# Patient Record
Sex: Female | Born: 1950 | Race: Black or African American | Hispanic: No | Smoking: Never smoker
Health system: Southern US, Community
[De-identification: ages and names within clinical notes are randomized; demographics above are authoritative.]

## PROBLEM LIST (undated history)

## (undated) DIAGNOSIS — T4145XA Adverse effect of unspecified anesthetic, initial encounter: Secondary | ICD-10-CM

## (undated) DIAGNOSIS — I890 Lymphedema, not elsewhere classified: Secondary | ICD-10-CM

## (undated) DIAGNOSIS — M1990 Unspecified osteoarthritis, unspecified site: Secondary | ICD-10-CM

## (undated) DIAGNOSIS — C4922 Malignant neoplasm of connective and soft tissue of left lower limb, including hip: Secondary | ICD-10-CM

## (undated) HISTORY — PX: APPENDECTOMY: SHX54

## (undated) HISTORY — PX: TUBAL LIGATION: SHX77

## (undated) HISTORY — DX: Malignant neoplasm of connective and soft tissue of left lower limb, including hip: C49.22

## (undated) HISTORY — PX: TONSILLECTOMY: SUR1361

---

## 1998-10-21 ENCOUNTER — Other Ambulatory Visit: Admission: RE | Admit: 1998-10-21 | Discharge: 1998-10-21 | Payer: Self-pay | Admitting: Family Medicine

## 1998-11-30 ENCOUNTER — Encounter: Payer: Self-pay | Admitting: Emergency Medicine

## 1998-11-30 ENCOUNTER — Emergency Department (HOSPITAL_COMMUNITY): Admission: EM | Admit: 1998-11-30 | Discharge: 1998-11-30 | Payer: Self-pay | Admitting: Emergency Medicine

## 2001-09-28 ENCOUNTER — Ambulatory Visit (HOSPITAL_COMMUNITY): Admission: RE | Admit: 2001-09-28 | Discharge: 2001-09-28 | Payer: Self-pay | Admitting: Gastroenterology

## 2001-09-28 ENCOUNTER — Encounter (INDEPENDENT_AMBULATORY_CARE_PROVIDER_SITE_OTHER): Payer: Self-pay | Admitting: *Deleted

## 2004-03-11 ENCOUNTER — Other Ambulatory Visit: Admission: RE | Admit: 2004-03-11 | Discharge: 2004-03-11 | Payer: Self-pay | Admitting: Family Medicine

## 2004-10-21 ENCOUNTER — Encounter: Admission: RE | Admit: 2004-10-21 | Discharge: 2004-10-21 | Payer: Self-pay | Admitting: Family Medicine

## 2005-06-24 ENCOUNTER — Other Ambulatory Visit: Admission: RE | Admit: 2005-06-24 | Discharge: 2005-06-24 | Payer: Self-pay | Admitting: Family Medicine

## 2006-07-23 ENCOUNTER — Other Ambulatory Visit: Admission: RE | Admit: 2006-07-23 | Discharge: 2006-07-23 | Payer: Self-pay | Admitting: Family Medicine

## 2007-08-18 ENCOUNTER — Other Ambulatory Visit: Admission: RE | Admit: 2007-08-18 | Discharge: 2007-08-18 | Payer: Self-pay | Admitting: Family Medicine

## 2008-09-11 ENCOUNTER — Other Ambulatory Visit: Admission: RE | Admit: 2008-09-11 | Discharge: 2008-09-11 | Payer: Self-pay | Admitting: Family Medicine

## 2009-10-08 ENCOUNTER — Other Ambulatory Visit: Admission: RE | Admit: 2009-10-08 | Discharge: 2009-10-08 | Payer: Self-pay | Admitting: Family Medicine

## 2010-03-16 DIAGNOSIS — T8859XA Other complications of anesthesia, initial encounter: Secondary | ICD-10-CM

## 2010-03-16 DIAGNOSIS — I890 Lymphedema, not elsewhere classified: Secondary | ICD-10-CM

## 2010-03-16 DIAGNOSIS — C4922 Malignant neoplasm of connective and soft tissue of left lower limb, including hip: Secondary | ICD-10-CM

## 2010-03-16 HISTORY — DX: Lymphedema, not elsewhere classified: I89.0

## 2010-03-16 HISTORY — DX: Malignant neoplasm of connective and soft tissue of left lower limb, including hip: C49.22

## 2010-03-16 HISTORY — DX: Other complications of anesthesia, initial encounter: T88.59XA

## 2010-03-16 HISTORY — PX: OTHER SURGICAL HISTORY: SHX169

## 2010-07-28 ENCOUNTER — Other Ambulatory Visit: Payer: Self-pay | Admitting: Family Medicine

## 2010-08-01 NOTE — Procedures (Signed)
[facility]  Patient:    Rachel Trevino, HARVELL Visit Number: [id number] MRN: [id number]          Service Type: END Location: ENDO Attending Physician:  Louie Bun Dictated by:   Everardo All Madilyn Fireman, M.D. Proc. Date: 09/28/01 Admit Date:  09/28/2001 Discharge Date: 09/28/2001   CC:         Duncan Dull, M.D.   Procedure Report  PROCEDURE:  Colonoscopy with polypectomy.  INDICATION FOR PROCEDURE:  Screening colonoscopy in a 60 year old patient with no prior colon screening.  DESCRIPTION OF PROCEDURE:  The patient was placed in the left lateral decubitus position then placed on the pulse monitor with continuous low flow oxygen delivered by nasal cannula. She was sedated with 60 mg IV Demerol and 6 mg IV Versed. The Olympus video colonoscope was inserted into the rectum and advanced to the cecum, confirmed by transillumination at McBurneys point and visualization of the ileocecal valve and appendiceal orifice. The prep was excellent. The cecum, ascending, transverse and descending colon all appeared normal with no masses, polyps, diverticula or other mucosal abnormalities. Within the rectum there was seen a small sessile polyp approximately 5 mm in diameter. This was fulgurated by hot biopsy. The remainder of the rectum appeared normal. The colonoscope was then withdrawn and the patient returned to the recovery room in stable condition. The patient tolerated the procedure well and there were no immediate complications.  IMPRESSION: 1. Small rectal polyp. 2. Left sided diverticulosis.  PLAN:  Await histology for determination of method and interval for future colon screening. Dictated by:   Everardo All Madilyn Fireman, M.D. Attending Physician:  Louie Bun DD:  09/28/01 TD:  10/03/01 Job: [id number]

## 2010-08-06 ENCOUNTER — Ambulatory Visit
Admission: RE | Admit: 2010-08-06 | Discharge: 2010-08-06 | Disposition: A | Discharge status: Home / Self Care | Payer: BC Managed Care – PPO | Source: Ambulatory Visit | Attending: Family Medicine | Admitting: Family Medicine

## 2010-08-08 ENCOUNTER — Ambulatory Visit (HOSPITAL_COMMUNITY)
Admission: RE | Admit: 2010-08-08 | Discharge: 2010-08-08 | Disposition: A | Discharge status: Home / Self Care | Payer: BC Managed Care – PPO | Source: Ambulatory Visit | Attending: Family Medicine | Admitting: Family Medicine

## 2010-08-08 ENCOUNTER — Other Ambulatory Visit: Payer: Self-pay | Admitting: Family Medicine

## 2010-08-08 ENCOUNTER — Other Ambulatory Visit (HOSPITAL_COMMUNITY): Payer: Self-pay | Admitting: Family Medicine

## 2010-08-08 DIAGNOSIS — R229 Localized swelling, mass and lump, unspecified: Secondary | ICD-10-CM | POA: Insufficient documentation

## 2010-08-08 DIAGNOSIS — C4920 Malignant neoplasm of connective and soft tissue of unspecified lower limb, including hip: Secondary | ICD-10-CM | POA: Insufficient documentation

## 2010-08-08 LAB — CREATININE, SERUM
Creatinine, Ser: 0.65 mg/dL (ref 0.4–1.2)
GFR calc Af Amer: >60 mL/min (ref >60)

## 2010-08-08 MED ORDER — GADOBENATE DIMEGLUMINE 529 MG/ML IV SOLN
18.0000 mL | Freq: Once | INTRAVENOUS | Status: AC
  Administered 2010-08-08: 18 mL via INTRAVENOUS

## 2010-08-14 ENCOUNTER — Encounter: Payer: BC Managed Care – PPO | Admitting: Internal Medicine

## 2010-11-05 ENCOUNTER — Other Ambulatory Visit (HOSPITAL_COMMUNITY)
Admission: RE | Admit: 2010-11-05 | Discharge: 2010-11-05 | Disposition: A | Discharge status: Home / Self Care | Payer: BC Managed Care – PPO | Source: Ambulatory Visit | Attending: Family Medicine | Admitting: Family Medicine

## 2010-11-05 DIAGNOSIS — Z124 Encounter for screening for malignant neoplasm of cervix: Secondary | ICD-10-CM | POA: Insufficient documentation

## 2011-04-22 ENCOUNTER — Ambulatory Visit: Payer: BC Managed Care – PPO | Attending: Orthopedic Surgery | Admitting: Physical Therapy

## 2011-04-22 DIAGNOSIS — I890 Lymphedema, not elsewhere classified: Secondary | ICD-10-CM | POA: Insufficient documentation

## 2011-04-22 DIAGNOSIS — IMO0001 Reserved for inherently not codable concepts without codable children: Secondary | ICD-10-CM | POA: Insufficient documentation

## 2011-04-27 ENCOUNTER — Ambulatory Visit: Payer: BC Managed Care – PPO | Admitting: Physical Therapy

## 2011-04-30 ENCOUNTER — Ambulatory Visit: Payer: BC Managed Care – PPO | Admitting: Physical Therapy

## 2011-05-04 ENCOUNTER — Ambulatory Visit: Payer: BC Managed Care – PPO

## 2011-05-06 ENCOUNTER — Ambulatory Visit: Payer: BC Managed Care – PPO

## 2011-05-11 ENCOUNTER — Ambulatory Visit: Payer: BC Managed Care – PPO

## 2011-05-13 ENCOUNTER — Ambulatory Visit: Payer: BC Managed Care – PPO | Admitting: Physical Therapy

## 2011-05-20 ENCOUNTER — Encounter: Payer: BC Managed Care – PPO | Admitting: Physical Therapy

## 2012-02-04 ENCOUNTER — Other Ambulatory Visit: Payer: Self-pay | Admitting: Family Medicine

## 2012-02-04 ENCOUNTER — Other Ambulatory Visit (HOSPITAL_COMMUNITY)
Admission: RE | Admit: 2012-02-04 | Discharge: 2012-02-04 | Disposition: A | Discharge status: Home / Self Care | Payer: BC Managed Care – PPO | Source: Ambulatory Visit | Attending: Family Medicine | Admitting: Family Medicine

## 2012-02-04 DIAGNOSIS — Z124 Encounter for screening for malignant neoplasm of cervix: Secondary | ICD-10-CM | POA: Insufficient documentation

## 2012-12-23 ENCOUNTER — Other Ambulatory Visit: Payer: Self-pay

## 2013-11-16 ENCOUNTER — Other Ambulatory Visit: Payer: Self-pay | Admitting: Family Medicine

## 2013-11-16 DIAGNOSIS — R229 Localized swelling, mass and lump, unspecified: Principal | ICD-10-CM

## 2013-11-16 DIAGNOSIS — IMO0002 Reserved for concepts with insufficient information to code with codable children: Secondary | ICD-10-CM

## 2013-11-21 ENCOUNTER — Other Ambulatory Visit: Payer: BC Managed Care – PPO

## 2013-12-05 ENCOUNTER — Ambulatory Visit
Admission: RE | Admit: 2013-12-05 | Discharge: 2013-12-05 | Disposition: A | Discharge status: Home / Self Care | Payer: BC Managed Care – PPO | Source: Ambulatory Visit | Attending: Family Medicine | Admitting: Family Medicine

## 2013-12-05 DIAGNOSIS — R229 Localized swelling, mass and lump, unspecified: Principal | ICD-10-CM

## 2013-12-05 DIAGNOSIS — IMO0002 Reserved for concepts with insufficient information to code with codable children: Secondary | ICD-10-CM

## 2014-03-22 ENCOUNTER — Ambulatory Visit: Payer: Medicare Other | Attending: Family Medicine | Admitting: Physical Therapy

## 2014-03-22 DIAGNOSIS — M25551 Pain in right hip: Secondary | ICD-10-CM | POA: Diagnosis not present

## 2014-03-22 DIAGNOSIS — G8929 Other chronic pain: Secondary | ICD-10-CM | POA: Insufficient documentation

## 2014-03-22 DIAGNOSIS — M25561 Pain in right knee: Secondary | ICD-10-CM | POA: Insufficient documentation

## 2014-03-22 NOTE — Therapy (Signed)
[place], [place], [address] Phone: [phone]   Fax:  [phone]  Physical Therapy Evaluation  Patient Details  Name: Rachel Trevino MRN: [id number] Date of Birth: 1950-09-24 Referring Provider:  Lilian Coma, MD  Encounter Date: 03/22/2014      PT End of Session - 03/22/14 1439    Visit Number 1   Number of Visits 12   Date for PT Re-Evaluation 05/03/14   PT Start Time 1332   PT Stop Time 1419   PT Time Calculation (min) 47 min   Activity Tolerance Patient tolerated treatment well      No past medical history on file.  No past surgical history on file.  There were no vitals taken for this visit.  Visit Diagnosis:  Knee pain, chronic, right - Plan: PT plan of care cert/re-cert  Hip pain, chronic, right - Plan: PT plan of care cert/re-cert      Subjective Assessment - 03/22/14 1338    Symptoms Patient presents with progressive pain in Rt. knee and hip.  She feels that because of her L. LE surgery (CA removal) and she favored her Rt. LE with gait.  She has pain in hip and knee, difficulty wallking, stiffness, RLE weakness.     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     Limitations Sitting;Walking   How long can you sit comfortably? 15 min   How long can you stand comfortably? 1 hour   How long can you walk comfortably? 1 hour   Diagnostic tests XR, having a bone density   Patient Stated Goals no pain   Currently in Pain? Yes   Pain Score 2    Pain Location Hip   Pain Orientation Right;Anterior   Pain Descriptors / Indicators Aching   Pain Type Chronic pain   Pain Onset More than a month ago   Pain Frequency Intermittent   Pain Relieving Factors pain meds, rest   Multiple Pain Sites Yes   Pain Score 2   Pain Type Chronic pain   Pain Location Knee   Pain Orientation Right;Anterior;Medial   Pain Descriptors / Indicators Aching   Pain  Frequency Intermittent   Pain Onset Progressive          [facility] PT Assessment - 03/22/14 1344    Assessment   Medical Diagnosis hip and knee arthritis   Onset Date --  chronic 2013   Next MD Visit unsure   Prior Therapy --  for LLE, lymphedema   Precautions   Precautions None   Restrictions   Weight Bearing Restrictions No   Balance Screen   Has the patient fallen in the past 6 months No   Has the patient had a decrease in activity level because of a fear of falling?  Yes   Is the patient reluctant to leave their home because of a fear of falling?  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   Overall Cognitive Status Within Functional Limits for tasks assessed   Observation/Other Assessments   Focus on Therapeutic Outcomes (FOTO)  58% limited   Sensation   Light Touch Appears Intact   Posture/Postural Control  Posture/Postural Control Postural limitations   Postural Limitations Anterior pelvic tilt;Weight shift left   Posture Comments increased pelvic rot R with gait, walks with limp favoring the LLE   AROM   Right Hip Flexion 85   Right Hip External Rotation  15   Left Hip Flexion 80   Left Hip External Rotation  45   Right Knee Extension 0   Right Knee Flexion 126   Left Knee Extension 0   Left Knee Flexion 128   Strength   Right Hip Flexion 4/5  pain   Right Hip Extension 3/5   Right Hip ABduction 3+/5   Left Hip Flexion --  4+/5   Left Hip Extension 3+/5   Left Hip ABduction 3+/5   Right Knee Flexion --  4+/5   Right Knee Extension --  4+/5   Left Knee Flexion 4/5   Left Knee Extension --  4+/5   Right Ankle Dorsiflexion 5/5   Left Ankle Dorsiflexion 5/5          [facility] Adult PT  Treatment/Exercise - 03/22/14 1344    Knee/Hip Exercises: Stretches   Hip Flexor Stretch 1 rep;30 seconds   Hip Flexor Stretch Limitations --  off table each LE   Knee/Hip Exercises: Supine   Other Supine Knee Exercises supine lower trunk rotaition with feet wide to ER and IR hip          PT Education - 03/22/14 1436    Education provided Yes   Education Details PT/POC, HEP   Person(s) Educated Patient   Methods Explanation;Demonstration;Handout   Comprehension Verbalized understanding;Returned demonstration          PT Short Term Goals - 03/22/14 1442    PT SHORT TERM GOAL #1   Title Pt will be I with initial HEP   Time 2   Period Weeks   Status New   PT SHORT TERM GOAL #2   Title Pt. will be able to sit for 20 min and report less difficulty getting up from chair.    Time 2   Period Weeks   Status New           PT Long Term Goals - 03/22/14 1443    PT LONG TERM GOAL #1   Title Pt. will be I with use of RICE vs heat and advanced HEP   Time 6   Period Weeks   Status New   PT LONG TERM GOAL #2   Title Pt will be able to sit 30 min for meals and report min pain/difficulty upon standing   Time 6   Period Weeks   Status New   PT LONG TERM GOAL #3   Title Pt will demo FOTO to less than 45% limited by knee/hip pain    Time 6   Period Weeks   Status New   PT LONG TERM GOAL #4   Title Pt. will report pain/comfort at night as improved 50% or better.    Time 6   Period Weeks   Status New   PT LONG TERM GOAL #5   Title Pt will walk without limp without cues most of the time   Time 6   Period Weeks   Status New               Plan - 03/22/14 1439    Clinical Impression Statement This patient exhibits hip>knee limitations in ROM and strength.  She will benefit from skilled PT to improve functional mobility.  Pt will benefit from skilled therapeutic intervention in order to improve on the following deficits Abnormal gait;Decreased activity  tolerance;Decreased mobility;Decreased strength;Pain;Increased fascial restricitons;Impaired flexibility;Decreased range of motion;Difficulty walking   Rehab Potential Good   PT Frequency 2x / week   PT Duration 4 weeks  4-6 weeks if needed   PT Treatment/Interventions ADLs/Self Care Home Management;Moist Heat;Therapeutic activities;Patient/family education;Passive range of motion;Therapeutic exercise;Ultrasound;Gait training;Manual techniques;Neuromuscular re-education;Cryotherapy;Electrical Stimulation;Functional mobility training   PT Next Visit Plan review HEP (hip ER/IR and psoas stretch) advance exercises, begin core/hip strength   PT Home Exercise Plan see above   Consulted and Agree with Plan of Care Patient          G-Codes - 04/21/2014 1449    Functional Assessment Tool Used FOTO   Functional Limitation Mobility: Walking and moving around   Mobility: Walking and Moving Around Current Status [phone]) At least 40 percent but less than 60 percent impaired, limited or restricted   Mobility: Walking and Moving Around Goal Status [phone]) At least 20 percent but less than 40 percent impaired, limited or restricted       Problem List There are no active problems to display for this patient.   Alyce Inscore 04/21/14, 2:51 PM  [facility] [address] [place], [place], [address] Phone: [phone]   Fax:  [phone]  Raeford Razor, PT April 21, 2014 2:52 PM Phone: [phone] Fax: [phone]

## 2014-03-22 NOTE — Patient Instructions (Addendum)
Lie flat (floor or bed) feet apart Drop R knee to L as the L knee goes to the L as well.   Then drop L. Knee to Rt. As Rt. Knee goes to the Rt.   Lower Trunk Rotation Stretch   Keeping back flat and feet together, rotate knees to left side. Hold ___10_ seconds. Repeat __10 times per set. Do __1-2__ sets per session. Do __1-2__ sessions per day.  http://orth.exer.us/123   Copyright  VHI. All rights reserved.   Hip Flexor Stretch   Lying on back near edge of bed, bend one leg, foot flat. Hang other leg over edge, relaxed, thigh resting entirely on bed for __1__ minutes. Repeat ___2-3_ times. Do ____ sessions per day. Advanced Exercise: Bend knee back keeping thigh in contact with bed.  http://gt2.exer.us/347   Copyright  VHI. All rights reserved.

## 2014-04-04 ENCOUNTER — Encounter: Payer: Self-pay | Admitting: Physical Therapy

## 2014-04-04 ENCOUNTER — Ambulatory Visit: Payer: Medicare Other | Admitting: Physical Therapy

## 2014-04-04 DIAGNOSIS — G8929 Other chronic pain: Secondary | ICD-10-CM

## 2014-04-04 DIAGNOSIS — M25561 Pain in right knee: Secondary | ICD-10-CM | POA: Diagnosis not present

## 2014-04-04 DIAGNOSIS — M25551 Pain in right hip: Principal | ICD-10-CM

## 2014-04-04 NOTE — Patient Instructions (Signed)
Hip Flexion / Knee Extension: Straight-Leg Raise (Eccentric)   Lie on back. Lift leg with knee straight. Slowly lower leg for 3-5 seconds. __10_ reps per set, _1-2__ sets per day, _5__ days per week. ABDUCTION: Side-Lying (Active)   Lie on left side, top leg straight. Raise top leg as far as possible keeping your hips stacked. Do not roll back or forward  Complete __1-2_ sets of ___10  repetitions. Perform _1-2__ sessions per day http://gtsc.exer.us/94      Copyright  VHI. All rights reserved.    Bridge [employer] small of back into mat, maintain pelvic tilt, roll up one vertebrae at a time. Focus on engaging posterior hip muscles. Hold for ___1-2_ breaths. Repeat ___10_ times.  Copyright  VHI. All rights reserved.

## 2014-04-04 NOTE — Therapy (Signed)
[place], [place], [address] Phone: [phone]   Fax:  [phone]  Physical Therapy Treatment  Patient Details  Name: Rachel Trevino MRN: [id number] Date of Birth: [birth year] Referring Provider:  Lilian Coma, MD  Encounter Date: 04/04/2014      PT End of Session - 04/04/14 1249    Visit Number 2   Number of Visits 12   Date for PT Re-Evaluation 05/03/14   PT Start Time 1230   PT Stop Time 1325   PT Time Calculation (min) 55 min      History reviewed. No pertinent past medical history.  History reviewed. No pertinent past surgical history.  There were no vitals taken for this visit.  Visit Diagnosis:  Hip pain, chronic, right  Knee pain, chronic, right      Subjective Assessment - 04/04/14 1233    Symptoms Patient without c/o pain today now, took Tylenol.  Tried the stretches and they help.    Currently in Pain? No/denies          [facility] Adult PT Treatment/Exercise - 04/04/14 1237    Knee/Hip Exercises: Stretches   Active Hamstring Stretch 3 reps;30 seconds   Hip Flexor Stretch 3 reps;30 seconds   ITB Stretch 3 reps;30 seconds   Soleus Stretch --  Other: hip ER/IRx 5 each way   Soleus Stretch Limitations hip add/ER stretch with strap 30 sec x 2   Knee/Hip Exercises: Aerobic   Stationary Bike NuStep level 5 for 5 min UE and LE   Knee/Hip Exercises: Supine   Bridges 1 set;10 reps   Straight Leg Raises Both;1 set;10 reps   Knee/Hip Exercises: Sidelying   Hip ABduction Strengthening;Both;1 set;20 reps   Other Sidelying Knee Exercises side kick series forward and back   Manual Therapy   Passive ROM --  Rt. hip ER/IR and quad stretch           PT Education - 04/04/14 1247    Education provided Yes   Education Details pain vs stretch, HEP   Person(s) Educated Patient   Methods Explanation;Demonstration;Handout   Comprehension Verbalized understanding          PT  Short Term Goals - 03/22/14 1442    PT SHORT TERM GOAL #1   Title Pt will be I with initial HEP   Time 2   Period Weeks   Status New   PT SHORT TERM GOAL #2   Title Pt. will be able to sit for 20 min and report less difficulty getting up from chair.    Time 2   Period Weeks   Status New           PT Long Term Goals - 03/22/14 1443    PT LONG TERM GOAL #1   Title Pt. will be I with use of RICE vs heat and advanced HEP   Time 6   Period Weeks   Status New   PT LONG TERM GOAL #2   Title Pt will be able to sit 30 min for meals and report min pain/difficulty upon standing   Time 6   Period Weeks   Status New   PT LONG TERM GOAL #3   Title Pt will demo FOTO to less than 45% limited by knee/hip pain    Time 6   Period Weeks   Status New   PT LONG TERM GOAL #4   Title Pt. will report pain/comfort at night as improved  50% or better.    Time 6   Period Weeks   Status New   PT LONG TERM GOAL #5   Title Pt will walk without limp without cues most of the time   Time 6   Period Weeks   Status New               Plan - 04/04/14 1313    Clinical Impression Statement Patient tolerated session well and reports relief of pain with walking in clinic. No goals met, 2nd visit. Eager for more HEP   PT Next Visit Plan review HEP, advance, repeat heat   PT Home Exercise Plan SLR, bridge added today   Consulted and Agree with Plan of Care Patient        Problem List There are no active problems to display for this patient.   PAA,JENNIFER 04/04/2014, 1:17 PM  [facility] [address] [place], [place], [address] Phone: [phone]   Fax:  [phone]

## 2014-04-11 ENCOUNTER — Ambulatory Visit: Payer: Medicare Other | Admitting: Physical Therapy

## 2014-04-11 ENCOUNTER — Telehealth: Payer: Self-pay | Admitting: *Deleted

## 2014-04-11 DIAGNOSIS — M25561 Pain in right knee: Secondary | ICD-10-CM | POA: Diagnosis not present

## 2014-04-11 DIAGNOSIS — M25551 Pain in right hip: Principal | ICD-10-CM

## 2014-04-11 DIAGNOSIS — G8929 Other chronic pain: Secondary | ICD-10-CM

## 2014-04-11 NOTE — Telephone Encounter (Signed)
Pt requested more appts...td

## 2014-04-12 NOTE — Therapy (Signed)
[place], [place], [address] Phone: [phone]   Fax:  [phone]  Physical Therapy Treatment  Patient Details  Name: Rachel Trevino MRN: [id number] Date of Birth: Nov 30, 1950 Referring Provider:  Lilian Coma, MD  Encounter Date: 04/11/2014      PT End of Session - 04/11/14 1241    Visit Number 3   Number of Visits 12   Date for PT Re-Evaluation 05/03/14   PT Start Time 1233   PT Stop Time 1326   PT Time Calculation (min) 53 min   Activity Tolerance Patient tolerated treatment well      No past medical history on file.  No past surgical history on file.  There were no vitals taken for this visit.  Visit Diagnosis:  Hip pain, chronic, right  Knee pain, chronic, right      Subjective Assessment - 04/11/14 1236    Symptoms Stiffness and sore today, been doing my exercises and using heat   Currently in Pain? Yes   Pain Score 7    Pain Location Hip   Pain Orientation Right;Posterior   Pain Descriptors / Indicators Aching   Pain Type Chronic pain   Pain Onset More than a month ago   Pain Frequency Intermittent   Multiple Pain Sites No                    [facility] Adult PT Treatment/Exercise - 04/11/14 1238    Knee/Hip Exercises: Stretches   Active Hamstring Stretch 2 reps;30 seconds   ITB Stretch 2 reps;30 seconds   ITB Stretch Limitations followed by add stretch 2x 30 sec   Knee/Hip Exercises: Machines for Strengthening   Cybex Leg Press 1 plate x 10 x 3 with varied stances   Knee/Hip Exercises: Supine   Bridges Strengthening;Both;1 set;10 reps   Straight Leg Raises 1 set;10 reps   Straight Leg Raise with External Rotation Strengthening;Both;1 set   Straight Leg Raise with External Rotation Limitations very difficult to ER Rt. hip, unable to do > 5 reps on Rt.    Other Supine Knee Exercises hip circles with T band black x 10 each    Other Supine Knee Exercises hip ER/IR  with trunk rot x 10   Moist Heat Therapy   Number Minutes Moist Heat 10 Minutes   Moist Heat Location --  Rt. hip/groin                PT Education - 04/12/14 0824    Education provided Yes   Education Details pain vs strain vs stretch with SLR   Person(s) Educated Patient   Methods Explanation;Demonstration   Comprehension Verbalized understanding          PT Short Term Goals - 04/11/14 1241    PT SHORT TERM GOAL #1   Title Pt will be I with initial HEP   Status Achieved   PT SHORT TERM GOAL #2   Title Pt. will be able to sit for 20 min and report less difficulty getting up from chair.    Status On-going           PT Long Term Goals - 04/11/14 1241    PT LONG TERM GOAL #1   Title Pt. will be I with use of RICE vs heat and advanced HEP   Status On-going   PT LONG TERM GOAL #2   Title Pt will be able to sit 30 min for meals and  report min pain/difficulty upon standing   Status On-going   PT LONG TERM GOAL #3   Title Pt will demo FOTO to less than 45% limited by knee/hip pain    Status On-going   PT LONG TERM GOAL #4   Title Pt. will report pain/comfort at night as improved 50% or better.    Status On-going   PT LONG TERM GOAL #5   Title Pt will walk without limp without cues most of the time   Status On-going               Plan - 04/11/14 1315    Clinical Impression Statement No new goals met other than I with initial HEP.  Limited in Rt hip ER/IR and  ability to lift Rt. LE off table.     PT Next Visit Plan continue, try standing SLR and/or reformer for footwork, bridge and leg circles        Problem List There are no active problems to display for this patient.   Rachel Trevino 04/12/2014, 8:27 AM  [place], [place], [address] Phone: [phone]   Fax:  [phone]

## 2014-04-18 ENCOUNTER — Ambulatory Visit: Payer: Medicare Other | Attending: Family Medicine | Admitting: Physical Therapy

## 2014-04-18 DIAGNOSIS — M25551 Pain in right hip: Secondary | ICD-10-CM | POA: Insufficient documentation

## 2014-04-18 DIAGNOSIS — G8929 Other chronic pain: Secondary | ICD-10-CM | POA: Diagnosis not present

## 2014-04-18 DIAGNOSIS — M25561 Pain in right knee: Secondary | ICD-10-CM | POA: Insufficient documentation

## 2014-04-18 NOTE — Therapy (Signed)
[place], [place], [address] Phone: [phone]   Fax:  [phone]  Physical Therapy Treatment  Patient Details  Name: Rachel Trevino MRN: [id number] Date of Birth: [birth year] Referring Provider:  Lilian Coma, MD  Encounter Date: 04/18/2014      PT End of Session - 04/18/14 0829    Visit Number 4   Number of Visits 12   Date for PT Re-Evaluation 05/03/14   PT Start Time 0804      No past medical history on file.  No past surgical history on file.  There were no vitals taken for this visit.  Visit Diagnosis:  Hip pain, chronic, right  Knee pain, chronic, right      Subjective Assessment - 04/18/14 0807    Symptoms Hurting today, this is early!   Currently in Pain? Yes   Pain Score 7    Pain Location Hip   Pain Orientation Right;Anterior   Pain Descriptors / Indicators Aching   Pain Type Chronic pain   Pain Radiating Towards thigh and Rt. knee   Pain Onset More than a month ago   Pain Frequency Intermittent   Aggravating Factors  unsure   Pain Relieving Factors gentle exercise   Multiple Pain Sites No           [facility] Adult PT Treatment/Exercise - 04/18/14 0810    Knee/Hip Exercises: Aerobic   Stationary Bike NuStep level 5 for 5 min UE and LE   Moist Heat Therapy   Number Minutes Moist Heat 15 Minutes   Moist Heat Location Other (comment)  Rt. ant hip/groin    Pilates Reformer used for LE/core strength, postural strength, lumbopelvic disassociation and core control.  Exercises included: Footwork: Neutral, V and heel raises 2 Red and 1 Blue. Cues to fully extend knees and maintain PF  Feet in Straps 1 Red spring, needs min A to get into position.  Parallel Arcs, ER and IR x 10 each     Hip Circles 1 Red, x 10 each direction.  Decr hip ER on Rt.  Poor control of Lt. LE.  Maintains neutral spine well Bridging all springs on x 10- max cues to maintain pelvis post tilted to  articulate spine.  Practiced mat bridging as well to do at home, limited by tight hip flexors as well. Pt tired but states "I feel like nothing's wrong with me" after exercise.        PT Education - 04/18/14 0818    Education provided Yes   Education Details Pilates reformer   Person(s) Educated Patient   Methods Explanation;Demonstration   Comprehension Verbalized understanding;Need further instruction          PT Short Term Goals - 04/11/14 1241    PT SHORT TERM GOAL #1   Title Pt will be I with initial HEP   Status Achieved   PT SHORT TERM GOAL #2   Title Pt. will be able to sit for 20 min and report less difficulty getting up from chair.    Status On-going           PT Long Term Goals - 04/11/14 1241    PT LONG TERM GOAL #1   Title Pt. will be I with use of RICE vs heat and advanced HEP   Status On-going   PT LONG TERM GOAL #2   Title Pt will be able to sit 30 min for meals and report min pain/difficulty upon standing  Status On-going   PT LONG TERM GOAL #3   Title Pt will demo FOTO to less than 45% limited by knee/hip pain    Status On-going   PT LONG TERM GOAL #4   Title Pt. will report pain/comfort at night as improved 50% or better.    Status On-going   PT LONG TERM GOAL #5   Title Pt will walk without limp without cues most of the time   Status On-going               Plan - 04/18/14 8185    Clinical Impression Statement Patient continues with pain and stiffness related to arthritis.  She tolerated Pilates Reformer today which reiterated decreased ER and IR of Rt. hip. No new goals met   PT Next Visit Plan try Reformer again, ant hip stretch   PT Home Exercise Plan SLR, bridge added today, cont   Consulted and Agree with Plan of Care Patient       Raeford Razor, PT 04/18/2014 8:56 AM Phone: [phone] Fax: [phone]   Problem List There are no active problems to display for this patient.   [name] 04/18/2014, 8:51 AM  [facility] [address] [place], [place], [address] Phone: [phone]   Fax:  [phone]

## 2014-04-23 ENCOUNTER — Telehealth: Payer: Self-pay | Admitting: *Deleted

## 2014-04-23 ENCOUNTER — Ambulatory Visit: Payer: Medicare Other | Admitting: Physical Therapy

## 2014-04-23 DIAGNOSIS — M25561 Pain in right knee: Secondary | ICD-10-CM

## 2014-04-23 DIAGNOSIS — M25551 Pain in right hip: Principal | ICD-10-CM

## 2014-04-23 DIAGNOSIS — G8929 Other chronic pain: Secondary | ICD-10-CM

## 2014-04-23 NOTE — Patient Instructions (Signed)
Hip Extension (Prone)   Copyright  VHI. All rights reserved.  Abduction: Side Leg Lift (Eccentric) - Side-Lying   Lie on side. Lift top leg slightly higher than shoulder level. Keep top leg straight with body, toes pointing forward. Slowly lower for 3-5 seconds. __10_ reps per set, __1_ sets per day, ___ days per week. Add ___ lbs when you achieve ___ repetitions.  Copyright  VHI. All rights reserved.  Strengthening: Straight Leg Raise (Phase 1)   Tighten muscles on front of right thigh, then lift leg __121__ inches from surface, keeping knee locked.  Repeat __10__ times per set. Do __1__ sets per session. Do _1___ sessions per day.  http://orth.exer.us/614   Copyright  VHI. All rights reserved.  Hamstring Stretch, Reclined (Strap, Doorframe)   Lengthen bottom leg on floor. Extend top leg along edge of doorframe or press foot up into yoga strap. Hold for ____ breaths. Repeat ____ times each leg. THEN CROSS LEG OVER THE MIDLINE to STRETCH OUTSIDE OF THE HIP THEN CROSS LEG OUT TO THE SIDE Copyright  VHI. All rights reserved.   Bridge [employer] small of back into mat, maintain pelvic tilt, roll up one vertebrae at a time. Focus on engaging posterior hip muscles. Hold for ___2-3_ breaths. Repeat __5-10__ times.  Copyright  VHI. All rights reserved.   External Rotation: Hip - Knees Apart With Pelvic Floor (Side-Lying)   Lie on left side with hips and knees slightly bent, band tied just above knees. Squeeze pelvic floor while lifting top knee. Hold for __5_ seconds. Rest for _5__ seconds. Repeat ___ times. Do __1_ times a day.   Copyright  VHI. All rights reserved.  Lower Trunk Rotation Stretch   Keeping back flat and feet together, rotate knees to left side. Hold ____10-15 seconds. Repeat __10__ times per set. Do __1__ sets per session. Do ___2_ sessions per day. KNEES WIDE http://orth.exer.us/123   Copyright  VHI. All rights reserved.

## 2014-04-23 NOTE — Therapy (Signed)
[place], [place], [address] Phone: [phone]   Fax:  [phone]  Physical Therapy Treatment  Patient Details  Name: Rachel Trevino MRN: [id number] Date of Birth: [birth year] Referring Provider:  Lilian Coma, MD  Encounter Date: 04/23/2014      PT End of Session - 04/23/14 1133    Visit Number 5   Number of Visits 12   Date for PT Re-Evaluation 05/03/14   PT Start Time 1100   PT Stop Time 1200   PT Time Calculation (min) 60 min   Activity Tolerance Patient tolerated treatment well      No past medical history on file.  No past surgical history on file.  There were no vitals taken for this visit.  Visit Diagnosis:  Hip pain, chronic, right  Knee pain, chronic, right      Subjective Assessment - 04/23/14 1114    Symptoms I did my exercises today already.  I feel pretty good.    Currently in Pain? No/denies                    [facility] Adult PT Treatment/Exercise - 04/23/14 1117    Knee/Hip Exercises: Stretches   Active Hamstring Stretch 2 reps;30 seconds   ITB Stretch 2 reps;30 seconds   Piriformis Stretch 2 reps;30 seconds   Knee/Hip Exercises: Aerobic   Stationary Bike NuStep level 6 for 8 min UE and LE   Knee/Hip Exercises: Supine   Heel Slides Strengthening;Both;1 set;10 reps   Bridges Strengthening;1 set;10 reps   Straight Leg Raises Strengthening;Both;1 set;10 reps   Straight Leg Raises Limitations --  on foam   Other Supine Knee Exercises foam stab ex  hip ER and IR stretch wide knees   Other Supine Knee Exercises UE, LE lift for stab.   Knee/Hip Exercises: Sidelying   Hip ABduction Strengthening;Both;1 set   Clams done in supine on roll bilat. and unilateral.    Moist Heat Therapy   Number Minutes Moist Heat 15 Minutes   Moist Heat Location Other (comment)  Rt. ant hip/groin      Used foam roller today for challenging stability in lumbar spine and deep  abdominals.         PT Short Term Goals - 04/23/14 1131    PT SHORT TERM GOAL #1   Title Pt will be I with initial HEP   Status Achieved   PT SHORT TERM GOAL #2   Title Pt. will be able to sit for 20 min and report less difficulty getting up from chair.    Status Achieved           PT Long Term Goals - 04/23/14 1131    PT LONG TERM GOAL #1   Title Pt. will be I with use of RICE vs heat and advanced HEP   Status On-going   PT LONG TERM GOAL #2   Title Pt will be able to sit 30 min for meals and report min pain/difficulty upon standing   Status On-going   PT LONG TERM GOAL #3   Title Pt will demo FOTO to less than 45% limited by knee/hip pain    Status On-going   PT LONG TERM GOAL #4   Title --  70% better   Status Achieved   PT LONG TERM GOAL #5   Title Pt will walk without limp without cues most of the time   Status On-going  Plan - 04/23/14 1202    Clinical Impression Statement Patient reports improvement with pain especially at night, and has less difficulty in general with transitions to stand after longer periods of sitting.  She reports always feeling better after her HEP and heat.  She will lilkely finish POC and be DC by 05/03/14.    PT Next Visit Plan try Reformer again, ant hip stretch   PT Home Exercise Plan given all today again (hip ROM and strength)   Consulted and Agree with Plan of Care Patient        Problem List There are no active problems to display for this patient.   Marenda Accardi 04/23/2014, 12:09 PM  [facility] [address] [place], [place], [address] Phone: [phone]   Fax:  [phone]

## 2014-04-23 NOTE — Telephone Encounter (Signed)
appts made and printed...td 

## 2014-04-25 ENCOUNTER — Ambulatory Visit: Payer: Medicare Other | Admitting: Physical Therapy

## 2014-04-25 DIAGNOSIS — M25561 Pain in right knee: Secondary | ICD-10-CM

## 2014-04-25 DIAGNOSIS — M25551 Pain in right hip: Principal | ICD-10-CM

## 2014-04-25 DIAGNOSIS — G8929 Other chronic pain: Secondary | ICD-10-CM

## 2014-04-25 NOTE — Therapy (Signed)
[place], [place], [address] Phone: [phone]   Fax:  [phone]  Physical Therapy Treatment  Patient Details  Name: Rachel Trevino MRN: [id number] Date of Birth: 06/09/1950 Referring Provider:  Lilian Coma, MD  Encounter Date: 04/25/2014      PT End of Session - 04/25/14 1245    Visit Number 6   Number of Visits 12   Date for PT Re-Evaluation 05/03/14   PT Start Time 1232   PT Stop Time 1330   PT Time Calculation (min) 58 min      No past medical history on file.  No past surgical history on file.  There were no vitals taken for this visit.  Visit Diagnosis:  Hip pain, chronic, right  Knee pain, chronic, right      Subjective Assessment - 04/25/14 1233    Symptoms I'm ok today, have one more visit   Currently in Pain? No/denies          [facility] Adult PT Treatment/Exercise - 04/25/14 1243    Knee/Hip Exercises: Aerobic   Tread Mill 2.0 mph and 5 min    Modalities   Modalities Moist Heat   Moist Heat Therapy   Number Minutes Moist Heat 15 Minutes   Moist Heat Location --  Rt. antlat hip      Pilates Reformer used for LE/core strength, postural strength, lumbopelvic disassociation and core control.  Exercises included: Footwork 2R 1B, double, single (2R) parallel and turn out.  Bridging all springs, still pushes with feet vs articulating spine and using abd Feet in Straps 1 R 1 Y arcs, parallel, turn out and circles Quadruped 1R arms, knees out and combo for core strength Ant hip stretch scooter each side x 5, 10 sec hold  MHP post to Rt. Groin in sitting.      PT Short Term Goals - 04/23/14 1131    PT SHORT TERM GOAL #1   Title Pt will be I with initial HEP   Status Achieved   PT SHORT TERM GOAL #2   Title Pt. will be able to sit for 20 min and report less difficulty getting up from chair.    Status Achieved           PT Long Term Goals - 04/23/14 1131    PT  LONG TERM GOAL #1   Title Pt. will be I with use of RICE vs heat and advanced HEP   Status On-going   PT LONG TERM GOAL #2   Title Pt will be able to sit 30 min for meals and report min pain/difficulty upon standing   Status On-going   PT LONG TERM GOAL #3   Title Pt will demo FOTO to less than 45% limited by knee/hip pain    Status On-going   PT LONG TERM GOAL #4   Title --  70% better   Status Achieved   PT LONG TERM GOAL #5   Title Pt will walk without limp without cues most of the time   Status On-going               Plan - 04/25/14 1006    Clinical Impression Statement Patient is accepting that she may continue to have pain but does always feel better following exercise. She demonstrates some lower abdominal/core weakness and decr hip ROM especially on the Reformer.  She will benefit from one more visit to review full HEP and principles to maintain  fitness, posture.    PT Next Visit Plan review full HEP, heat and add standing ant hip stretch (lunge)   PT Home Exercise Plan given all today again (hip ROM and strength)   Consulted and Agree with Plan of Care Patient        Problem List There are no active problems to display for this patient.   PAA,JENNIFER 04/26/2014, 12:12 PM  [place], [place], [address] Phone: [phone]   Fax:  [phone]

## 2014-05-02 ENCOUNTER — Ambulatory Visit: Payer: Medicare Other | Admitting: Physical Therapy

## 2014-05-02 DIAGNOSIS — G8929 Other chronic pain: Secondary | ICD-10-CM

## 2014-05-02 DIAGNOSIS — M25561 Pain in right knee: Secondary | ICD-10-CM

## 2014-05-02 DIAGNOSIS — M25551 Pain in right hip: Principal | ICD-10-CM

## 2014-05-02 NOTE — Patient Instructions (Signed)
Full HEP review, walking program

## 2014-05-02 NOTE — Therapy (Signed)
[place], [place], [address] Phone: [phone]   Fax:  [phone]  Physical Therapy Treatment  Patient Details  Name: Rachel Trevino MRN: [id number] Date of Birth: 03/28/1950 Referring Provider:  Lilian Coma, MD  Encounter Date: 05/02/2014      PT End of Session - 05/02/14 1458    Visit Number 7   Number of Visits 12   Date for PT Re-Evaluation 05/03/14   PT Start Time 4158   PT Stop Time 1455   PT Time Calculation (min) 37 min   Activity Tolerance Patient tolerated treatment well      No past medical history on file.  No past surgical history on file.  There were no vitals taken for this visit.  Visit Diagnosis:  Hip pain, chronic, right  Knee pain, chronic, right      Subjective Assessment - 05/02/14 1425    Symptoms No c/o today, its my last day!    How long can you sit comfortably? sharp pain is gone once i get moving.    How long can you stand comfortably? over 1 hour without pain    How long can you walk comfortably? 1 hour if in the mall or out in community   Patient Stated Goals no pain   Currently in Pain? No/denies          [facility] PT Assessment - 05/02/14 1444    Assessment   Medical Diagnosis hip and knee arthritis   AROM   Right Hip External Rotation  35   Right Hip Internal Rotation  20   Strength   Right Hip Flexion 4+/5  no pain   Right Hip ABduction 4-/5   Left Hip Flexion 4+/5   Left Hip ABduction 3+/5   Right Knee Flexion 4+/5   Right Knee Extension 5/5   Left Knee Flexion 4+/5   Left Knee Extension 5/5         [facility] Adult PT Treatment/Exercise - 05/02/14 1438    Knee/Hip Exercises: Stretches   Active Hamstring Stretch 2 reps;30 seconds   ITB Stretch 2 reps;30 seconds   Knee/Hip Exercises: Aerobic   Tread Mill 2.0 mph  7 min    Knee/Hip Exercises: Supine   Bridges 1 set;10 reps   Straight Leg Raises Strengthening;Both;1 set;10 reps   Knee/Hip Exercises: Sidelying   Hip ABduction Both;1 set;10 reps    Also performed pelvic tilt and hip adductor stretch, supine ER and IR stretching. Patient requested PT take a picture for her home use.          PT Education - 05/02/14 1455    Education provided Yes   Education Details HEP   Person(s) Educated Patient   Methods Explanation;Demonstration   Comprehension Verbalized understanding;Returned demonstration          PT Short Term Goals - 05/02/14 1508    PT SHORT TERM GOAL #1   Title Pt will be I with initial HEP   Status Achieved   PT SHORT TERM GOAL #2   Title Pt. will be able to sit for 20 min and report less difficulty getting up from chair.    Status Achieved           PT Long Term Goals - 05/02/14 1508    PT LONG TERM GOAL #1   Title Pt. will be I with use of RICE vs heat and advanced HEP   Status Achieved   PT LONG TERM  GOAL #2   Title Pt will be able to sit 30 min for meals and report min pain/difficulty upon standing   Status Achieved   PT LONG TERM GOAL #3   Title Pt will demo FOTO to less than 45% limited by knee/hip pain   36%   Status Achieved               Plan - 05/11/14 1507    Clinical Impression Statement Patient has met all LTG s and is pleased with current functional level. SHe has surpassed predicted level of limitation according to FOTO.  DC to HEP.    PT Home Exercise Plan DC   Consulted and Agree with Plan of Care Patient          G-Codes - May 11, 2014 1511    Functional Assessment Tool Used FOTO   Functional Limitation Mobility: Walking and moving around   Mobility: Walking and Moving Around Current Status [phone]) At least 20 percent but less than 40 percent impaired, limited or restricted   Mobility: Walking and Moving Around Goal Status [phone]) At least 20 percent but less than 40 percent impaired, limited or restricted   Mobility: Walking and Moving Around Discharge Status [phone]) At least 20 percent but less than  40 percent impaired, limited or restricted      Problem List There are no active problems to display for this patient.   PAA,JENNIFER 2014/05/11, 3:14 PM  [facility] [address] [place], [place], [address] Phone: [phone]   Fax:  [phone]    PHYSICAL THERAPY DISCHARGE SUMMARY  Visits from Start of Care: 7 Current functional level related to goals / functional outcomes:See above for goals met   Remaining deficits: Still has decreased hip strength and decreased Rt. Hip ER and IR.  Deficits do not limit function.    Education / Equipment: HEP, [employer], general ex principles.  Plan: Patient agrees to discharge.  Patient goals were met. Patient is being discharged due to meeting the stated rehab goals.  ?????     Raeford Razor, PT 05-11-14 3:16 PM Phone: [phone] Fax: [phone]

## 2015-03-15 ENCOUNTER — Other Ambulatory Visit (HOSPITAL_COMMUNITY): Payer: Self-pay | Admitting: Orthopaedic Surgery

## 2015-03-20 NOTE — Patient Instructions (Signed)
Rachel Trevino  03/20/2015   Your procedure is scheduled on: 03/29/2015    Report to [facility] Main  Entrance take [facility]  elevators to 3rd floor to  [place] at   1000 AM.  Call this number if you have problems the morning of surgery [phone]   Remember: ONLY 1 PERSON MAY GO WITH YOU TO SHORT STAY TO GET  READY MORNING OF [place].  Do not eat food or drink liquids :After Midnight.     Take these medicines the morning of surgery with A SIP OF WATER: none                                 You may not have any metal on your body including hair pins and              piercings  Do not wear jewelry, make-up, lotions, powders or perfumes, deodorant             Do not wear nail polish.  Do not shave  48 hours prior to surgery.           Do not bring valuables to the hospital. [place].  Contacts, dentures or bridgework may not be worn into surgery.  Leave suitcase in the car. After surgery it may be brought to your room.      Special Instructions: coughing and deep breathing exercises, leg exercises               Please read over the following fact sheets you were given: _____________________________________________________________________             [facility] - Preparing for Surgery Before surgery, you can play an important role.  Because skin is not sterile, your skin needs to be as free of germs as possible.  You can reduce the number of germs on your skin by washing with CHG (chlorahexidine gluconate) soap before surgery.  CHG is an antiseptic cleaner which kills germs and bonds with the skin to continue killing germs even after washing. Please DO NOT use if you have an allergy to CHG or antibacterial soaps.  If your skin becomes reddened/irritated stop using the CHG and inform your nurse when you arrive at Short Stay. Do not shave (including legs and underarms) for at least 48 hours prior to  the first CHG shower.  You may shave your face/neck. Please follow these instructions carefully:  1.  Shower with CHG Soap the night before surgery and the  morning of Surgery.  2.  If you choose to wash your hair, wash your hair first as usual with your  normal  shampoo.  3.  After you shampoo, rinse your hair and body thoroughly to remove the  shampoo.                           4.  Use CHG as you would any other liquid soap.  You can apply chg directly  to the skin and wash                       Gently with a scrungie or clean washcloth.  5.  Apply the CHG Soap to your body ONLY FROM THE NECK DOWN.   Do not use on face/ open                           Wound or open sores. Avoid contact with eyes, ears mouth and genitals (private parts).                       Wash face,  Genitals (private parts) with your normal soap.             6.  Wash thoroughly, paying special attention to the area where your surgery  will be performed.  7.  Thoroughly rinse your body with warm water from the neck down.  8.  DO NOT shower/wash with your normal soap after using and rinsing off  the CHG Soap.                9.  Pat yourself dry with a clean towel.            10.  Wear clean pajamas.            11.  Place clean sheets on your bed the night of your first shower and do not  sleep with pets. Day of Surgery : Do not apply any lotions/deodorants the morning of surgery.  Please wear clean clothes to the hospital/surgery center.  FAILURE TO FOLLOW THESE INSTRUCTIONS MAY RESULT IN THE CANCELLATION OF YOUR SURGERY PATIENT SIGNATURE_________________________________  NURSE SIGNATURE__________________________________  ________________________________________________________________________  WHAT IS A BLOOD TRANSFUSION? Blood Transfusion Information  A transfusion is the replacement of blood or some of its parts. Blood is made up of multiple cells which provide different functions.  Red blood cells carry oxygen and  are used for blood loss replacement.  White blood cells fight against infection.  Platelets control bleeding.  Plasma helps clot blood.  Other blood products are available for specialized needs, such as hemophilia or other clotting disorders. BEFORE THE TRANSFUSION  Who gives blood for transfusions?   Healthy volunteers who are fully evaluated to make sure their blood is safe. This is blood bank blood. Transfusion therapy is the safest it has ever been in the practice of medicine. Before blood is taken from a donor, a complete history is taken to make sure that person has no history of diseases nor engages in risky social behavior (examples are intravenous drug use or sexual activity with multiple partners). The donor's travel history is screened to minimize risk of transmitting infections, such as malaria. The donated blood is tested for signs of infectious diseases, such as HIV and hepatitis. The blood is then tested to be sure it is compatible with you in order to minimize the chance of a transfusion reaction. If you or a relative donates blood, this is often done in anticipation of surgery and is not appropriate for emergency situations. It takes many days to process the donated blood. RISKS AND COMPLICATIONS Although transfusion therapy is very safe and saves many lives, the main dangers of transfusion include:   Getting an infectious disease.  Developing a transfusion reaction. This is an allergic reaction to something in the blood you were given. Every precaution is taken to prevent this. The decision to have a blood transfusion has been considered carefully by your caregiver before blood is given. Blood is not given unless the benefits outweigh the risks. AFTER THE TRANSFUSION  Right after receiving a  blood transfusion, you will usually feel much better and more energetic. This is especially true if your red blood cells have gotten low (anemic). The transfusion raises the level of the  red blood cells which carry oxygen, and this usually causes an energy increase.  The nurse administering the transfusion will monitor you carefully for complications. HOME CARE INSTRUCTIONS  No special instructions are needed after a transfusion. You may find your energy is better. Speak with your caregiver about any limitations on activity for underlying diseases you may have. SEEK MEDICAL CARE IF:   Your condition is not improving after your transfusion.  You develop redness or irritation at the intravenous (IV) site. SEEK IMMEDIATE MEDICAL CARE IF:  Any of the following symptoms occur over the next 12 hours:  Shaking chills.  You have a temperature by mouth above 102 F (38.9 C), not controlled by medicine.  Chest, back, or muscle pain.  People around you feel you are not acting correctly or are confused.  Shortness of breath or difficulty breathing.  Dizziness and fainting.  You get a rash or develop hives.  You have a decrease in urine output.  Your urine turns a dark color or changes to pink, red, or brown. Any of the following symptoms occur over the next 10 days:  You have a temperature by mouth above 102 F (38.9 C), not controlled by medicine.  Shortness of breath.  Weakness after normal activity.  The white part of the eye turns yellow (jaundice).  You have a decrease in the amount of urine or are urinating less often.  Your urine turns a dark color or changes to pink, red, or brown. Document Released: 02/28/2000 Document Revised: 05/25/2011 Document Reviewed: 10/17/2007 [facility] Patient Information 2014 [place], [place].  _______________________________________________________________________

## 2015-03-25 ENCOUNTER — Encounter (HOSPITAL_COMMUNITY)
Admission: RE | Admit: 2015-03-25 | Discharge: 2015-03-25 | Disposition: A | Discharge status: Home / Self Care | Payer: 59 | Source: Ambulatory Visit | Attending: Orthopaedic Surgery | Admitting: Orthopaedic Surgery

## 2015-03-26 ENCOUNTER — Other Ambulatory Visit (HOSPITAL_COMMUNITY): Payer: Self-pay | Admitting: *Deleted

## 2015-03-26 NOTE — Patient Instructions (Signed)
KEIGHLY FRANZ  03/26/2015   Your procedure is scheduled on: 03-29-15  Report to [facility] Main  Entrance take [facility]  elevators to 3rd floor to  [place] at 1000 AM.  Call this number if you have problems the morning of surgery [phone]   Remember: ONLY 1 PERSON MAY GO WITH YOU TO SHORT STAY TO GET  READY MORNING OF [place].  Do not eat food or drink liquids :After Midnight.     Take these medicines the morning of surgery with A SIP OF WATER: NONE              You may not have any metal on your body including hair pins and              piercings  Do not wear jewelry, make-up, lotions, powders or perfumes, deodorant             Do not wear nail polish.  Do not shave  48 hours prior to surgery.              Men may shave face and neck.   Do not bring valuables to the hospital. [place].  Contacts, dentures or bridgework may not be worn into surgery.  Leave suitcase in the car. After surgery it may be brought to your room.     Patients discharged the day of surgery will not be allowed to drive home.  Name and phone number of your driver:  Special Instructions: N/A              Please read over the following fact sheets you were given: _____________________________________________________________________             [facility] - Preparing for Surgery Before surgery, you can play an important role.  Because skin is not sterile, your skin needs to be as free of germs as possible.  You can reduce the number of germs on your skin by washing with CHG (chlorahexidine gluconate) soap before surgery.  CHG is an antiseptic cleaner which kills germs and bonds with the skin to continue killing germs even after washing. Please DO NOT use if you have an allergy to CHG or antibacterial soaps.  If your skin becomes reddened/irritated stop using the CHG and inform your nurse when you arrive at Short  Stay. Do not shave (including legs and underarms) for at least 48 hours prior to the first CHG shower.  You may shave your face/neck. Please follow these instructions carefully:  1.  Shower with CHG Soap the night before surgery and the  morning of Surgery.  2.  If you choose to wash your hair, wash your hair first as usual with your  normal  shampoo.  3.  After you shampoo, rinse your hair and body thoroughly to remove the  shampoo.                           4.  Use CHG as you would any other liquid soap.  You can apply chg directly  to the skin and wash                       Gently with a scrungie or clean washcloth.  5.  Apply the CHG Soap to your body ONLY FROM THE NECK DOWN.   Do not use on face/ open                           Wound or open sores. Avoid contact with eyes, ears mouth and genitals (private parts).                       Wash face,  Genitals (private parts) with your normal soap.             6.  Wash thoroughly, paying special attention to the area where your surgery  will be performed.  7.  Thoroughly rinse your body with warm water from the neck down.  8.  DO NOT shower/wash with your normal soap after using and rinsing off  the CHG Soap.                9.  Pat yourself dry with a clean towel.            10.  Wear clean pajamas.            11.  Place clean sheets on your bed the night of your first shower and do not  sleep with pets. Day of Surgery : Do not apply any lotions/deodorants the morning of surgery.  Please wear clean clothes to the hospital/surgery center.  FAILURE TO FOLLOW THESE INSTRUCTIONS MAY RESULT IN THE CANCELLATION OF YOUR SURGERY PATIENT SIGNATURE_________________________________  NURSE SIGNATURE__________________________________  ________________________________________________________________________

## 2015-03-27 ENCOUNTER — Encounter (HOSPITAL_COMMUNITY)
Admission: RE | Admit: 2015-03-27 | Discharge: 2015-03-27 | Disposition: A | Discharge status: Home / Self Care | Payer: Medicare Other | Source: Ambulatory Visit | Attending: Orthopaedic Surgery | Admitting: Orthopaedic Surgery

## 2015-03-27 ENCOUNTER — Encounter (HOSPITAL_COMMUNITY): Payer: Self-pay

## 2015-03-27 HISTORY — DX: Lymphedema, not elsewhere classified: I89.0

## 2015-03-27 HISTORY — DX: Adverse effect of unspecified anesthetic, initial encounter: T41.45XA

## 2015-03-27 HISTORY — DX: Unspecified osteoarthritis, unspecified site: M19.90

## 2015-03-27 LAB — BASIC METABOLIC PANEL
ANION GAP: 10 (ref 5–15)
BUN: 10 mg/dL (ref 6–20)
CHLORIDE: 103 mmol/L (ref 101–111)
CO2: 28 mmol/L (ref 22–32)
Calcium: 9.7 mg/dL (ref 8.9–10.3)
Creatinine, Ser: 0.64 mg/dL (ref 0.44–1.00)
GFR calc Af Amer: >60 mL/min (ref >60)
GLUCOSE: 114 mg/dL — AB (ref 65–99)
POTASSIUM: 4.7 mmol/L (ref 3.5–5.1)
Sodium: 141 mmol/L (ref 135–145)

## 2015-03-27 LAB — CBC
HEMATOCRIT: 38 % (ref 36.0–46.0)
HEMOGLOBIN: 13.2 g/dL (ref 12.0–15.0)
MCH: 27.7 pg (ref 26.0–34.0)
MCHC: 34.7 g/dL (ref 30.0–36.0)
MCV: 79.8 fL (ref 78.0–100.0)
Platelets: 336 10*3/uL (ref 150–400)
RBC: 4.76 MIL/uL (ref 3.87–5.11)
RDW: 13.3 % (ref 11.5–15.5)
WBC: 10.2 10*3/uL (ref 4.0–10.5)

## 2015-03-27 LAB — SURGICAL PCR SCREEN
MRSA, PCR: NEGATIVE
STAPHYLOCOCCUS AUREUS: NEGATIVE

## 2015-03-27 LAB — ABO/RH: ABO/RH(D): B POS

## 2015-03-27 NOTE — Progress Notes (Signed)
Spoke with dr Kalman Shan Ralene Muskrat, made aware patient with cough with yellow/white sputum x 1 week, no fever, chest xray not needed for pre op visit today per dr rose.

## 2015-03-29 ENCOUNTER — Inpatient Hospital Stay (HOSPITAL_COMMUNITY): Payer: Medicare Other

## 2015-03-29 ENCOUNTER — Inpatient Hospital Stay (HOSPITAL_COMMUNITY)
Admission: RE | Admit: 2015-03-29 | Discharge: 2015-03-31 | DRG: 470 | Disposition: A | Discharge status: Home Health | Payer: Medicare Other | Source: Ambulatory Visit | Attending: Orthopaedic Surgery | Admitting: Orthopaedic Surgery

## 2015-03-29 ENCOUNTER — Encounter (HOSPITAL_COMMUNITY): Payer: Self-pay | Admitting: Anesthesiology

## 2015-03-29 ENCOUNTER — Encounter (HOSPITAL_COMMUNITY)
Admission: RE | Disposition: A | Discharge status: Home Health | Payer: Self-pay | Source: Ambulatory Visit | Attending: Orthopaedic Surgery

## 2015-03-29 ENCOUNTER — Inpatient Hospital Stay (HOSPITAL_COMMUNITY): Payer: Medicare Other | Admitting: Anesthesiology

## 2015-03-29 DIAGNOSIS — Z471 Aftercare following joint replacement surgery: Secondary | ICD-10-CM

## 2015-03-29 DIAGNOSIS — Z96641 Presence of right artificial hip joint: Secondary | ICD-10-CM

## 2015-03-29 DIAGNOSIS — Z01812 Encounter for preprocedural laboratory examination: Secondary | ICD-10-CM

## 2015-03-29 DIAGNOSIS — M25551 Pain in right hip: Secondary | ICD-10-CM | POA: Diagnosis present

## 2015-03-29 DIAGNOSIS — M1611 Unilateral primary osteoarthritis, right hip: Secondary | ICD-10-CM | POA: Diagnosis present

## 2015-03-29 HISTORY — PX: TOTAL HIP ARTHROPLASTY: SHX124

## 2015-03-29 LAB — TYPE AND SCREEN
ABO/RH(D): B POS
ANTIBODY SCREEN: NEGATIVE

## 2015-03-29 SURGERY — ARTHROPLASTY, HIP, TOTAL, ANTERIOR APPROACH
Anesthesia: Monitor Anesthesia Care | Site: Hip | Laterality: Right

## 2015-03-29 MED ORDER — SODIUM CHLORIDE 0.9 % IV SOLN
INTRAVENOUS | Status: DC
  Administered 2015-03-29: 75 mL/h via INTRAVENOUS
  Administered 2015-03-30: 05:00:00 via INTRAVENOUS

## 2015-03-29 MED ORDER — PHENYLEPHRINE HCL 10 MG/ML IJ SOLN
10.0000 mg | INTRAMUSCULAR | Status: DC | PRN
  Administered 2015-03-29: 25 ug/min via INTRAVENOUS

## 2015-03-29 MED ORDER — DIPHENHYDRAMINE HCL 12.5 MG/5ML PO ELIX: 12.5000 mg | ORAL_SOLUTION | ORAL | Status: DC | PRN

## 2015-03-29 MED ORDER — PANTOPRAZOLE SODIUM 40 MG PO TBEC
40.0000 mg | DELAYED_RELEASE_TABLET | Freq: Every day | ORAL | Status: DC
  Administered 2015-03-29 – 2015-03-31 (×3): 40 mg via ORAL
  Filled 2015-03-29 (×3): qty 1

## 2015-03-29 MED ORDER — SODIUM CHLORIDE 0.9 % IR SOLN
Status: DC | PRN
  Administered 2015-03-29: 1000 mL

## 2015-03-29 MED ORDER — DOCUSATE SODIUM 100 MG PO CAPS
100.0000 mg | ORAL_CAPSULE | Freq: Two times a day (BID) | ORAL | Status: DC
  Administered 2015-03-29 – 2015-03-31 (×4): 100 mg via ORAL

## 2015-03-29 MED ORDER — FENTANYL CITRATE (PF) 100 MCG/2ML IJ SOLN
INTRAMUSCULAR | Status: AC
  Filled 2015-03-29: qty 2

## 2015-03-29 MED ORDER — CEFAZOLIN SODIUM 1-5 GM-% IV SOLN
1.0000 g | Freq: Four times a day (QID) | INTRAVENOUS | Status: AC
  Administered 2015-03-29 (×2): 1 g via INTRAVENOUS
  Filled 2015-03-29 (×2): qty 50

## 2015-03-29 MED ORDER — OXYCODONE HCL 5 MG/5ML PO SOLN
5.0000 mg | Freq: Once | ORAL | Status: DC | PRN
  Filled 2015-03-29: qty 5

## 2015-03-29 MED ORDER — HYDROMORPHONE HCL 1 MG/ML IJ SOLN: 0.2500 mg | INTRAMUSCULAR | Status: DC | PRN

## 2015-03-29 MED ORDER — PHENYLEPHRINE 40 MCG/ML (10ML) SYRINGE FOR IV PUSH (FOR BLOOD PRESSURE SUPPORT)
PREFILLED_SYRINGE | INTRAVENOUS | Status: AC
Start: 2015-03-29 — End: 2015-03-29
  Filled 2015-03-29: qty 10

## 2015-03-29 MED ORDER — LATANOPROST 0.005 % OP SOLN
1.0000 [drp] | Freq: Every day | OPHTHALMIC | Status: DC
  Administered 2015-03-29 – 2015-03-30 (×2): 1 [drp] via OPHTHALMIC
  Filled 2015-03-29: qty 2.5

## 2015-03-29 MED ORDER — DEXAMETHASONE SODIUM PHOSPHATE 10 MG/ML IJ SOLN
INTRAMUSCULAR | Status: AC
  Filled 2015-03-29: qty 1

## 2015-03-29 MED ORDER — ONDANSETRON HCL 4 MG/2ML IJ SOLN: 4.0000 mg | Freq: Four times a day (QID) | INTRAMUSCULAR | Status: DC | PRN

## 2015-03-29 MED ORDER — POLYETHYLENE GLYCOL 3350 17 G PO PACK
17.0000 g | PACK | Freq: Every day | ORAL | Status: DC | PRN
  Administered 2015-03-30: 17 g via ORAL
  Filled 2015-03-29: qty 1

## 2015-03-29 MED ORDER — PHENOL 1.4 % MT LIQD: 1.0000 | OROMUCOSAL | Status: DC | PRN

## 2015-03-29 MED ORDER — ACETAMINOPHEN 650 MG RE SUPP: 650.0000 mg | Freq: Four times a day (QID) | RECTAL | Status: DC | PRN

## 2015-03-29 MED ORDER — OXYCODONE HCL 5 MG PO TABS: 5.0000 mg | ORAL_TABLET | Freq: Once | ORAL | Status: DC | PRN

## 2015-03-29 MED ORDER — KETOROLAC TROMETHAMINE 30 MG/ML IJ SOLN: 30.0000 mg | Freq: Once | INTRAMUSCULAR | Status: DC

## 2015-03-29 MED ORDER — FENTANYL CITRATE (PF) 100 MCG/2ML IJ SOLN
INTRAMUSCULAR | Status: DC | PRN
  Administered 2015-03-29 (×2): 50 ug via INTRAVENOUS

## 2015-03-29 MED ORDER — LACTATED RINGERS IV SOLN
INTRAVENOUS | Status: DC | PRN
  Administered 2015-03-29 (×2): via INTRAVENOUS

## 2015-03-29 MED ORDER — TRANEXAMIC ACID 1000 MG/10ML IV SOLN
1000.0000 mg | INTRAVENOUS | Status: AC
  Administered 2015-03-29: 1000 mg via INTRAVENOUS
  Filled 2015-03-29: qty 10

## 2015-03-29 MED ORDER — ONDANSETRON HCL 4 MG PO TABS: 4.0000 mg | ORAL_TABLET | Freq: Four times a day (QID) | ORAL | Status: DC | PRN

## 2015-03-29 MED ORDER — GUAIFENESIN ER 600 MG PO TB12
600.0000 mg | ORAL_TABLET | Freq: Two times a day (BID) | ORAL | Status: DC
  Administered 2015-03-30: 600 mg via ORAL
  Filled 2015-03-29 (×7): qty 1

## 2015-03-29 MED ORDER — METHOCARBAMOL 500 MG PO TABS
500.0000 mg | ORAL_TABLET | Freq: Four times a day (QID) | ORAL | Status: DC | PRN
  Administered 2015-03-29: 500 mg via ORAL
  Filled 2015-03-29: qty 1

## 2015-03-29 MED ORDER — PROPOFOL 500 MG/50ML IV EMUL
INTRAVENOUS | Status: DC | PRN
  Administered 2015-03-29: 65 ug/kg/min via INTRAVENOUS

## 2015-03-29 MED ORDER — PROPOFOL 10 MG/ML IV BOLUS
INTRAVENOUS | Status: AC
  Filled 2015-03-29: qty 40

## 2015-03-29 MED ORDER — MENTHOL 3 MG MT LOZG: 1.0000 | LOZENGE | OROMUCOSAL | Status: DC | PRN

## 2015-03-29 MED ORDER — PROMETHAZINE HCL 25 MG/ML IJ SOLN: 6.2500 mg | INTRAMUSCULAR | Status: DC | PRN

## 2015-03-29 MED ORDER — OXYCODONE HCL 5 MG PO TABS
5.0000 mg | ORAL_TABLET | ORAL | Status: DC | PRN
  Administered 2015-03-29: 5 mg via ORAL
  Administered 2015-03-29 (×2): 10 mg via ORAL
  Administered 2015-03-29: 5 mg via ORAL
  Administered 2015-03-30 (×2): 10 mg via ORAL
  Administered 2015-03-30: 5 mg via ORAL
  Administered 2015-03-31: 10 mg via ORAL
  Administered 2015-03-31: 5 mg via ORAL
  Filled 2015-03-29 (×2): qty 1
  Filled 2015-03-29: qty 2
  Filled 2015-03-29: qty 1
  Filled 2015-03-29 (×3): qty 2
  Filled 2015-03-29: qty 1
  Filled 2015-03-29: qty 2

## 2015-03-29 MED ORDER — METOCLOPRAMIDE HCL 5 MG/ML IJ SOLN: 5.0000 mg | Freq: Three times a day (TID) | INTRAMUSCULAR | Status: DC | PRN

## 2015-03-29 MED ORDER — CEFAZOLIN SODIUM-DEXTROSE 2-3 GM-% IV SOLR
2.0000 g | INTRAVENOUS | Status: AC
  Administered 2015-03-29: 2 g via INTRAVENOUS

## 2015-03-29 MED ORDER — ONDANSETRON HCL 4 MG/2ML IJ SOLN
INTRAMUSCULAR | Status: AC
  Filled 2015-03-29: qty 2

## 2015-03-29 MED ORDER — CEFAZOLIN SODIUM-DEXTROSE 2-3 GM-% IV SOLR
INTRAVENOUS | Status: AC
  Filled 2015-03-29: qty 50

## 2015-03-29 MED ORDER — BUPIVACAINE HCL (PF) 0.5 % IJ SOLN
INTRAMUSCULAR | Status: DC | PRN
  Administered 2015-03-29: 3 mL

## 2015-03-29 MED ORDER — STERILE WATER FOR IRRIGATION IR SOLN
Status: DC | PRN
  Administered 2015-03-29: 1000 mL

## 2015-03-29 MED ORDER — METHOCARBAMOL 1000 MG/10ML IJ SOLN
500.0000 mg | Freq: Four times a day (QID) | INTRAVENOUS | Status: DC | PRN
  Filled 2015-03-29: qty 5

## 2015-03-29 MED ORDER — ACETAMINOPHEN 325 MG PO TABS
650.0000 mg | ORAL_TABLET | Freq: Four times a day (QID) | ORAL | Status: DC | PRN
  Administered 2015-03-30: 650 mg via ORAL
  Filled 2015-03-29: qty 2

## 2015-03-29 MED ORDER — ONDANSETRON HCL 4 MG/2ML IJ SOLN
INTRAMUSCULAR | Status: AC
Start: 2015-03-29 — End: 2015-03-29
  Filled 2015-03-29: qty 2

## 2015-03-29 MED ORDER — ASPIRIN EC 325 MG PO TBEC
325.0000 mg | DELAYED_RELEASE_TABLET | Freq: Two times a day (BID) | ORAL | Status: DC
  Administered 2015-03-30 – 2015-03-31 (×3): 325 mg via ORAL
  Filled 2015-03-29 (×5): qty 1

## 2015-03-29 MED ORDER — ONDANSETRON HCL 4 MG/2ML IJ SOLN
INTRAMUSCULAR | Status: DC | PRN
  Administered 2015-03-29: 4 mg via INTRAVENOUS

## 2015-03-29 MED ORDER — ALUM & MAG HYDROXIDE-SIMETH 200-200-20 MG/5ML PO SUSP: 30.0000 mL | ORAL | Status: DC | PRN

## 2015-03-29 MED ORDER — DEXAMETHASONE SODIUM PHOSPHATE 10 MG/ML IJ SOLN
INTRAMUSCULAR | Status: DC | PRN
  Administered 2015-03-29: 10 mg via INTRAVENOUS

## 2015-03-29 MED ORDER — ADULT MULTIVITAMIN LIQUID CH
5.0000 mL | Freq: Every day | ORAL | Status: DC
  Administered 2015-03-30 – 2015-03-31 (×2): 5 mL via ORAL
  Filled 2015-03-29 (×3): qty 5

## 2015-03-29 MED ORDER — HYDROMORPHONE HCL 1 MG/ML IJ SOLN: 1.0000 mg | INTRAMUSCULAR | Status: DC | PRN

## 2015-03-29 MED ORDER — MIDAZOLAM HCL 5 MG/5ML IJ SOLN
INTRAMUSCULAR | Status: DC | PRN
  Administered 2015-03-29: 2 mg via INTRAVENOUS

## 2015-03-29 MED ORDER — METOCLOPRAMIDE HCL 10 MG PO TABS: 5.0000 mg | ORAL_TABLET | Freq: Three times a day (TID) | ORAL | Status: DC | PRN

## 2015-03-29 MED ORDER — MIDAZOLAM HCL 2 MG/2ML IJ SOLN
INTRAMUSCULAR | Status: AC
  Filled 2015-03-29: qty 2

## 2015-03-29 SURGICAL SUPPLY — 35 items
BAG ZIPLOCK 12X15 (MISCELLANEOUS) IMPLANT
BENZOIN TINCTURE PRP APPL 2/3 (GAUZE/BANDAGES/DRESSINGS) ×3 IMPLANT
BLADE SAW SGTL 18X1.27X75 (BLADE) ×2 IMPLANT
BLADE SAW SGTL 18X1.27X75MM (BLADE) ×1
CAPT HIP TOTAL 2 ×3 IMPLANT
CELLS DAT CNTRL 66122 CELL SVR (MISCELLANEOUS) ×1 IMPLANT
CLOSURE WOUND 1/2 X4 (GAUZE/BANDAGES/DRESSINGS) ×1
CLOTH BEACON ORANGE TIMEOUT ST (SAFETY) ×3 IMPLANT
DRAPE STERI IOBAN 125X83 (DRAPES) ×3 IMPLANT
DRAPE U-SHAPE 47X51 STRL (DRAPES) ×6 IMPLANT
DRSG AQUACEL AG ADV 3.5X10 (GAUZE/BANDAGES/DRESSINGS) ×3 IMPLANT
DURAPREP 26ML APPLICATOR (WOUND CARE) ×3 IMPLANT
ELECT REM PT RETURN 9FT ADLT (ELECTROSURGICAL) ×3
ELECTRODE REM PT RTRN 9FT ADLT (ELECTROSURGICAL) ×1 IMPLANT
GAUZE XEROFORM 1X8 LF (GAUZE/BANDAGES/DRESSINGS) IMPLANT
GLOVE BIO SURGEON STRL SZ7.5 (GLOVE) ×3 IMPLANT
GLOVE BIOGEL PI IND STRL 8 (GLOVE) ×2 IMPLANT
GLOVE BIOGEL PI INDICATOR 8 (GLOVE) ×4
GLOVE ECLIPSE 8.0 STRL XLNG CF (GLOVE) ×3 IMPLANT
GOWN STRL REUS W/TWL XL LVL3 (GOWN DISPOSABLE) ×6 IMPLANT
HANDPIECE INTERPULSE COAX TIP (DISPOSABLE) ×2
HOLDER FOLEY CATH W/STRAP (MISCELLANEOUS) ×3 IMPLANT
PACK ANTERIOR HIP CUSTOM (KITS) ×3 IMPLANT
RTRCTR WOUND ALEXIS 18CM MED (MISCELLANEOUS) ×3
SET HNDPC FAN SPRY TIP SCT (DISPOSABLE) ×1 IMPLANT
STAPLER VISISTAT 35W (STAPLE) IMPLANT
STRIP CLOSURE SKIN 1/2X4 (GAUZE/BANDAGES/DRESSINGS) ×2 IMPLANT
SUT ETHIBOND NAB CT1 #1 30IN (SUTURE) ×3 IMPLANT
SUT MNCRL AB 4-0 PS2 18 (SUTURE) IMPLANT
SUT VIC AB 0 CT1 36 (SUTURE) ×3 IMPLANT
SUT VIC AB 1 CT1 36 (SUTURE) ×3 IMPLANT
SUT VIC AB 2-0 CT1 27 (SUTURE) ×4
SUT VIC AB 2-0 CT1 TAPERPNT 27 (SUTURE) ×2 IMPLANT
TRAY FOLEY W/METER SILVER 14FR (SET/KITS/TRAYS/PACK) ×3 IMPLANT
TRAY FOLEY W/METER SILVER 16FR (SET/KITS/TRAYS/PACK) IMPLANT

## 2015-03-29 NOTE — Evaluation (Signed)
Physical Therapy Evaluation Patient Details Name: Rachel Trevino MRN: [id number] DOB: 05/06/1950 Today's Date: 03/29/2015   History of Present Illness  Pt s/p R THR; pt wtih hx of L LE lymphedema  Clinical Impression  Pt s/p R THR presents with decreased R LE strength/ROM and post op pain limiting functional mobility.  Pt should progress to dc home with family assist and HHPT follow up.    Follow Up Recommendations Home health PT    Equipment Recommendations  None recommended by PT    Recommendations for Other Services OT consult     Precautions / Restrictions Precautions Precautions: Fall Restrictions Weight Bearing Restrictions: No Other Position/Activity Restrictions: WBAT      Mobility  Bed Mobility Overal bed mobility: Needs Assistance Bed Mobility: Supine to Sit     Supine to sit: Min assist;+2 for physical assistance;+2 for safety/equipment;HOB elevated     General bed mobility comments: cues for sequence and use of L LE to self assist.  Physical assist to manage R LE and to bring trunk to upright  Transfers Overall transfer level: Needs assistance Equipment used: Rolling walker (2 wheeled) Transfers: Sit to/from Stand Sit to Stand: Min assist;+2 safety/equipment;From elevated surface         General transfer comment: cues for LE management and use of UEs to self assist  Ambulation/Gait Ambulation/Gait assistance: Min assist;+2 safety/equipment Ambulation Distance (Feet): 17 Feet Assistive device: Rolling walker (2 wheeled) Gait Pattern/deviations: Step-to pattern;Decreased step length - right;Decreased step length - left;Shuffle;Trunk flexed Gait velocity: decr Gait velocity interpretation: Below normal speed for age/gender General Gait Details: cues for sequence, posture and position from [employer]            Wheelchair Mobility    Modified Rankin (Stroke Patients Only)       Balance                                             Pertinent Vitals/Pain Pain Assessment: 0-10 Pain Score: 4  Pain Location: R hip Pain Descriptors / Indicators: Aching;Sore;Burning Pain Intervention(s): Limited activity within patient's tolerance;Monitored during session;Premedicated before session;Ice applied    Home Living Family/patient expects to be discharged to:: Private residence Living Arrangements: Spouse/significant other Available Help at Discharge: Family Type of Home: House Home Access: Stairs to enter Entrance Stairs-Rails: None Technical brewer of Steps: 2 Home Layout: One level Home Equipment: Environmental consultant - standard      Prior Function Level of Independence: Independent               Hand Dominance        Extremity/Trunk Assessment   Upper Extremity Assessment: Overall WFL for tasks assessed           Lower Extremity Assessment: RLE deficits/detail      Cervical / Trunk Assessment: Normal  Communication   Communication: No difficulties  Cognition Arousal/Alertness: Awake/alert Behavior During Therapy: WFL for tasks assessed/performed Overall Cognitive Status: Within Functional Limits for tasks assessed                      General Comments      Exercises Total Joint Exercises Ankle Circles/Pumps: AROM;Both;15 reps;Supine      Assessment/Plan    PT Assessment Patient needs continued PT services  PT Diagnosis Difficulty walking   PT Problem List Decreased strength;Decreased range of motion;Decreased activity  tolerance;Decreased mobility;Decreased knowledge of use of DME;Pain  PT Treatment Interventions DME instruction;Gait training;Stair training;Therapeutic activities;Therapeutic exercise;Functional mobility training;Patient/family education   PT Goals (Current goals can be found in the Care Plan section) Acute Rehab PT Goals Patient Stated Goal: Resume previous lifestyle with decreased pain PT Goal Formulation: With patient Time For Goal Achievement:  04/03/15 Potential to Achieve Goals: Good    Frequency 7X/week   Barriers to discharge        Co-evaluation               End of Session Equipment Utilized During Treatment: Gait belt Activity Tolerance: Patient tolerated treatment well Patient left: in chair;with call bell/phone within reach;with family/visitor present Nurse Communication: Mobility status         Time: [id number] PT Time Calculation (min) (ACUTE ONLY): 24 min   Charges:   PT Evaluation $PT Eval Low Complexity: 1 Procedure PT Treatments $Gait Training: 8-22 mins   PT G Codes:        Landa Mullinax 04-27-2015, 6:12 PM

## 2015-03-29 NOTE — H&P (Signed)
TOTAL HIP ADMISSION H&P  Patient is admitted for right total hip arthroplasty.  Subjective:  Chief Complaint: right hip pain  HPI: Rachel Trevino, 66 y.o. female, has a history of pain and functional disability in the right hip(s) due to arthritis and patient has failed non-surgical conservative treatments for greater than 12 weeks to include NSAID's and/or analgesics, corticosteriod injections, flexibility and strengthening excercises, use of assistive devices, weight reduction as appropriate and activity modification.  Onset of symptoms was gradual starting 5 years ago with gradually worsening course since that time.The patient noted no past surgery on the right hip(s).  Patient currently rates pain in the right hip at 9 out of 10 with activity. Patient has night pain, worsening of pain with activity and weight bearing, trendelenberg gait, pain that interfers with activities of daily living and pain with passive range of motion. Patient has evidence of subchondral sclerosis, periarticular osteophytes and joint space narrowing by imaging studies. This condition presents safety issues increasing the risk of falls.  There is no current active infection.  Patient Active Problem List   Diagnosis Date Noted  . Osteoarthritis of right hip 03/29/2015   Past Medical History  Diagnosis Date  . Cough with expectoration  since 03-20-14    white to yeloow mucous  . Lymphedema 2012    left leg  . 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 Arthritis     oa  . Complication of anesthesia 2012    needed fiber optic,  scope for good vocal cord view, see letter from [facility]  on chart    Past Surgical History  Procedure Laterality Date  . Left leg surgery for sarcoma  2012  . Tubal ligation    . Appendectomy    . Tonsillectomy      No prescriptions prior to admission   No Known Allergies  Social History  Substance Use Topics  . Smoking status: Never Smoker   . Smokeless  tobacco: Not on file  . Alcohol Use: No    No family history on file.   Review of Systems  Musculoskeletal: Positive for joint pain.  All other systems reviewed and are negative.   Objective:  Physical Exam  Constitutional: She is oriented to person, place, and time. She appears well-developed and well-nourished.  HENT:  Head: Normocephalic and atraumatic.  Eyes: EOM are normal. Pupils are equal, round, and reactive to light.  Neck: Normal range of motion. Neck supple.  Cardiovascular: Normal rate.   Respiratory: Effort normal and breath sounds normal.  GI: Soft. Bowel sounds are normal.  Musculoskeletal:       Right hip: She exhibits decreased range of motion, decreased strength, tenderness, bony tenderness and crepitus.  Neurological: She is alert and oriented to person, place, and time.  Skin: Skin is warm and dry.  Psychiatric: She has a normal mood and affect.    Vital signs in last 24 hours:    Labs:   There is no height or weight on file to calculate BMI.   Imaging Review Plain radiographs demonstrate severe degenerative joint disease of the right hip(s). The bone quality appears to be good for age and reported activity level.  Assessment/Plan:  End stage arthritis, right hip(s)  The patient history, physical examination, clinical judgement of the provider and imaging studies are consistent with end stage degenerative joint disease of the right hip(s) and total hip arthroplasty is deemed medically necessary. The treatment options  including medical management, injection therapy, arthroscopy and arthroplasty were discussed at length. The risks and benefits of total hip arthroplasty were presented and reviewed. The risks due to aseptic loosening, infection, stiffness, dislocation/subluxation,  thromboembolic complications and other imponderables were discussed.  The patient acknowledged the explanation, agreed to proceed with the plan and consent was signed. Patient is  being admitted for inpatient treatment for surgery, pain control, PT, OT, prophylactic antibiotics, VTE prophylaxis, progressive ambulation and ADL's and discharge planning.The patient is planning to be discharged home with home health services

## 2015-03-29 NOTE — Progress Notes (Signed)
Utilization review completed.  

## 2015-03-29 NOTE — Anesthesia Procedure Notes (Signed)
Spinal Patient location during procedure: OR Staffing Anesthesiologist: Suzette Battiest Performed by: anesthesiologist  Preanesthetic Checklist Completed: patient identified, site marked, surgical consent, pre-op evaluation, timeout performed, IV checked, risks and benefits discussed and monitors and equipment checked Spinal Block Patient position: sitting Prep: Betadine Patient monitoring: heart rate, continuous pulse ox and blood pressure Approach: left paramedian Location: L4-5 Injection technique: single-shot Needle Needle type: Quincke  Needle gauge: 22 G Needle length: 9 cm Additional Notes Expiration date of kit checked and confirmed. Patient tolerated procedure well, without complications. Multiple attempts made at 3 separate levels both median and paramedian approaches. CSF finally obtained at Left paramedian interspace L4-5. LA injected with resultant spinal block. Pt tolerated well with no immediate complications.

## 2015-03-29 NOTE — Transfer of Care (Signed)
Immediate Anesthesia Transfer of Care Note  Patient: Rachel Trevino  Procedure(s) Performed: Procedure(s): RIGHT TOTAL HIP ARTHROPLASTY ANTERIOR APPROACH (Right)  Patient Location: PACU  Anesthesia Type:SpinalT10  Level of Consciousness:  sedated, patient cooperative and responds to stimulation  Airway & Oxygen Therapy:Patient Spontanous Breathing and Patient connected to face mask oxgen  Post-op Assessment:  Report given to PACU RN and Post -op Vital signs reviewed and stable  Post vital signs:  Reviewed and stable  Last Vitals:  Filed Vitals:   03/29/15 0939  BP: 159/86  Pulse: 104  Temp: 36.6 C  Resp: 18    Complications: No apparent anesthesia complications

## 2015-03-29 NOTE — Brief Op Note (Signed)
03/29/2015  12:41 PM  PATIENT:  Rachel Trevino  65 y.o. female  PRE-OPERATIVE DIAGNOSIS:  right hip osteoarthritis  POST-OPERATIVE DIAGNOSIS:  right hip osteoarthritis  PROCEDURE:  Procedure(s): RIGHT TOTAL HIP ARTHROPLASTY ANTERIOR APPROACH (Right)  SURGEON:  Surgeon(s) and Role:    * Mcarthur Rossetti, MD - Primary  PHYSICIAN ASSISTANT: Benita Stabile, PA-C  ANESTHESIA:   spinal  EBL:  Total I/O In: 1600 [I.V.:1600] Out: 250 [Urine:150; Blood:100]  COUNTS:  YES  TOURNIQUET:  * No tourniquets in log *  DICTATION: .Other Dictation: Dictation Number [id number]  PLAN OF CARE: Admit to inpatient   PATIENT DISPOSITION:  PACU - hemodynamically stable.   Delay start of Pharmacological VTE agent (>24hrs) due to surgical blood loss or risk of bleeding: no

## 2015-03-29 NOTE — Anesthesia Postprocedure Evaluation (Signed)
Anesthesia Post Note  Patient: Rachel Trevino  Procedure(s) Performed: Procedure(s) (LRB): RIGHT TOTAL HIP ARTHROPLASTY ANTERIOR APPROACH (Right)  Patient location during evaluation: PACU Anesthesia Type: MAC and Spinal Level of consciousness: awake and alert Pain management: pain level controlled Vital Signs Assessment: post-procedure vital signs reviewed and stable Respiratory status: spontaneous breathing Cardiovascular status: blood pressure returned to baseline Postop Assessment: spinal receding Anesthetic complications: no    Last Vitals:  Filed Vitals:   03/29/15 1345 03/29/15 1356  BP: 121/70 116/69  Pulse: 59 68  Temp: 36.3 C 36.3 C  Resp: 13 14    Last Pain:  Filed Vitals:   03/29/15 1411  PainSc: 0-No pain                 Tiajuana Amass

## 2015-03-29 NOTE — Anesthesia Preprocedure Evaluation (Addendum)
Anesthesia Evaluation  Patient identified by MRN, date of birth, ID band Patient awake    Reviewed: Allergy & Precautions, NPO status , Patient's Chart, lab work & pertinent test results  History of Anesthesia Complications (+) DIFFICULT AIRWAY and history of anesthetic complications (Hx of difficult airway requiring multiple attempts including unsuccessful DL x 2, unsuccessful glidescope intubation with eventual successful asleep fiberoptic intubation. )  Airway Mallampati: IV  TM Distance: >3 FB Neck ROM: Full    Dental  (+) Dental Advisory Given   Pulmonary neg pulmonary ROS,    breath sounds clear to auscultation       Cardiovascular negative cardio ROS   Rhythm:Regular Rate:Normal     Neuro/Psych negative neurological ROS     GI/Hepatic Neg liver ROS,   Endo/Other  negative endocrine ROS  Renal/GU negative Renal ROS     Musculoskeletal  (+) Arthritis ,   Abdominal   Peds  Hematology negative hematology ROS (+)   Anesthesia Other Findings   Reproductive/Obstetrics                            Lab Results  Component Value Date   WBC 10.2 03/27/2015   HGB 13.2 03/27/2015   HCT 38.0 03/27/2015   MCV 79.8 03/27/2015   PLT 336 03/27/2015   Lab Results  Component Value Date   CREATININE 0.64 03/27/2015   BUN 10 03/27/2015   NA 141 03/27/2015   K 4.7 03/27/2015   CL 103 03/27/2015   CO2 28 03/27/2015   No results found for: INR, PROTIME  Anesthesia Physical Anesthesia Plan  ASA: II  Anesthesia Plan: MAC and Spinal   Post-op Pain Management:    Induction: Intravenous  Airway Management Planned: Natural Airway and Simple Face Mask  Additional Equipment:   Intra-op Plan:   Post-operative Plan:   Informed Consent: I have reviewed the patients History and Physical, chart, labs and discussed the procedure including the risks, benefits and alternatives for the proposed  anesthesia with the patient or authorized representative who has indicated his/her understanding and acceptance.   Dental advisory given  Plan Discussed with: CRNA  Anesthesia Plan Comments:        Anesthesia Quick Evaluation

## 2015-03-30 LAB — CBC
HCT: 33.5 % — ABNORMAL LOW (ref 36.0–46.0)
Hemoglobin: 11.7 g/dL — ABNORMAL LOW (ref 12.0–15.0)
MCH: 27.9 pg (ref 26.0–34.0)
MCHC: 34.9 g/dL (ref 30.0–36.0)
MCV: 79.8 fL (ref 78.0–100.0)
PLATELETS: 327 10*3/uL (ref 150–400)
RBC: 4.2 MIL/uL (ref 3.87–5.11)
RDW: 13.3 % (ref 11.5–15.5)
WBC: 17.4 10*3/uL — ABNORMAL HIGH (ref 4.0–10.5)

## 2015-03-30 LAB — BASIC METABOLIC PANEL
Anion gap: 10 (ref 5–15)
BUN: 11 mg/dL (ref 6–20)
CALCIUM: 9 mg/dL (ref 8.9–10.3)
CO2: 26 mmol/L (ref 22–32)
CREATININE: 0.59 mg/dL (ref 0.44–1.00)
Chloride: 101 mmol/L (ref 101–111)
GFR calc Af Amer: >60 mL/min (ref >60)
GLUCOSE: 136 mg/dL — AB (ref 65–99)
Potassium: 4.5 mmol/L (ref 3.5–5.1)
SODIUM: 137 mmol/L (ref 135–145)

## 2015-03-30 MED ORDER — METHOCARBAMOL 500 MG PO TABS: 500.0000 mg | ORAL_TABLET | Freq: Four times a day (QID) | ORAL | Status: DC | PRN

## 2015-03-30 MED ORDER — ASPIRIN 325 MG PO TBEC: 325.0000 mg | DELAYED_RELEASE_TABLET | Freq: Two times a day (BID) | ORAL | Status: DC

## 2015-03-30 MED ORDER — OXYCODONE HCL 5 MG PO TABS: 5.0000 mg | ORAL_TABLET | ORAL | Status: DC | PRN

## 2015-03-30 NOTE — Progress Notes (Signed)
Subjective: 1 Day Post-Op Procedure(s) (LRB): RIGHT TOTAL HIP ARTHROPLASTY ANTERIOR APPROACH (Right) Patient reports pain as moderate.  Minimal acute blood loss anemia from surgery.  Working well with therapy.  Objective: Vital signs in last 24 hours: Temp:  [97.3 F (36.3 C)-99.3 F (37.4 C)] 97.7 F (36.5 C) (01/14 0949) Pulse Rate:  [59-82] 61 (01/14 0949) Resp:  [11-20] 16 (01/14 0949) BP: (102-133)/(60-89) 125/71 mmHg (01/14 0949) SpO2:  [97 %-100 %] 100 % (01/14 0949) Weight:  [83.008 kg (183 lb)] 83.008 kg (183 lb) (01/13 1356)  Intake/Output from previous day: 01/13 0701 - 01/14 0700 In: 2230 [P.O.:480; I.V.:1750] Out: 2810 [Urine:2710; Blood:100] Intake/Output this shift:     Recent Labs  03/27/15 1205 03/30/15 0430  HGB 13.2 11.7*    Recent Labs  03/27/15 1205 03/30/15 0430  WBC 10.2 17.4*  RBC 4.76 4.20  HCT 38.0 33.5*  PLT 336 327    Recent Labs  03/27/15 1205 03/30/15 0430  NA 141 137  K 4.7 4.5  CL 103 101  CO2 28 26  BUN 10 11  CREATININE 0.64 0.59  GLUCOSE 114* 136*  CALCIUM 9.7 9.0   No results for input(s): LABPT, INR in the last 72 hours.  Sensation intact distally Intact pulses distally Dorsiflexion/Plantar flexion intact Incision: scant drainage  Assessment/Plan: 1 Day Post-Op Procedure(s) (LRB): RIGHT TOTAL HIP ARTHROPLASTY ANTERIOR APPROACH (Right) Up with therapy Discharge home with home health next 1-2 days.  Markeshia Giebel Y 03/30/2015, 11:14 AM

## 2015-03-30 NOTE — Discharge Instructions (Signed)

## 2015-03-30 NOTE — Care Management (Addendum)
CM spoke with patient at the bedside. Patient is set-up with [facility] for [place]. Has a walker at home which her husband will have modified (install wheels). She states he works for a [employer]. No other CM needs. Presenter, broadcasting BSN CCM

## 2015-03-30 NOTE — Evaluation (Signed)
Occupational Therapy Evaluation Patient Details Name: Rachel Trevino MRN: [id number] DOB: 03-27-1950 Today's Date: 03/30/2015    History of Present Illness Pt s/p R THR; pt wtih hx of L LE lymphedema   Clinical Impression   Patient presenting with decreased ADL and functional mobility independence secondary to above. Patient independent PTA. Patient currently functioning at an overall supervision to min assist level, needing up to max assist for LB ADLs. Patient will benefit from acute OT to increase overall independence in the areas of ADLs, functional mobility, education on AE to increase LB ADL independence, and overall safety in order to safely discharge home with husband assisting.     Follow Up Recommendations  No OT follow up;Supervision - Intermittent    Equipment Recommendations  3 in 1 bedside comode    Recommendations for Other Services  None at this time    Precautions / Restrictions Precautions Precautions: Fall Restrictions Weight Bearing Restrictions: Yes RLE Weight Bearing: Weight bearing as tolerated    Mobility Bed Mobility General bed mobility comments: Pt found seated in recliner upon OT entering/exiting room   Transfers Overall transfer level: Needs assistance Equipment used: Rolling walker (2 wheeled) Transfers: Sit to/from Stand Sit to Stand: Min guard General transfer comment: cues for LE management and use of UEs to self assist    Balance Overall balance assessment: Needs assistance Sitting-balance support: No upper extremity supported;Feet supported Sitting balance-Leahy Scale: Good     Standing balance support: Bilateral upper extremity supported;During functional activity Standing balance-Leahy Scale: Fair    ADL Overall ADL's : Needs assistance/impaired Eating/Feeding: Set up;Sitting   Grooming: Set up;Sitting   Upper Body Bathing: Set up;Supervision/ safety;Sitting   Lower Body Bathing: Moderate assistance;Sit to/from stand    Upper Body Dressing : Set up;Supervision/safety;Sitting   Lower Body Dressing: Maximal assistance;Sit to/from stand   Toilet Transfer: Supervision/safety;RW;Grab bars;BSC;Cueing for sequencing;Cueing for safety   Toileting- Clothing Manipulation and Hygiene: Supervision/safety;Sitting/lateral lean   Tub/ Shower Transfer: Supervision/safety;Anterior/posterior;Rolling walker;Walk-in shower   Functional mobility during ADLs: Supervision/safety;Cueing for safety;Cueing for sequencing;Rolling walker      Pertinent Vitals/Pain Pain Assessment: Faces Faces Pain Scale: Hurts little more Pain Location: right hip with movement/mobility  Pain Descriptors / Indicators: Aching;Sore;Discomfort Pain Intervention(s): Limited activity within patient's tolerance;Monitored during session;Repositioned     Hand Dominance Right   Extremity/Trunk Assessment Upper Extremity Assessment Upper Extremity Assessment: Overall WFL for tasks assessed   Lower Extremity Assessment Lower Extremity Assessment: Defer to PT evaluation   Cervical / Trunk Assessment Cervical / Trunk Assessment: Normal   Communication Communication Communication: No difficulties   Cognition Arousal/Alertness: Awake/alert Behavior During Therapy: WFL for tasks assessed/performed Overall Cognitive Status: Within Functional Limits for tasks assessed              Home Living Family/patient expects to be discharged to:: Private residence Living Arrangements: Spouse/significant other Available Help at Discharge: Family Type of Home: House Home Access: Stairs to enter Technical brewer of Steps: 2 Entrance Stairs-Rails: None Home Layout: One level     Bathroom Shower/Tub: Walk-in shower;Door   [employer] Toilet: Standard     Home Equipment: Walker - standard    Prior Functioning/Environment Level of Independence: Independent     OT Diagnosis: Generalized weakness;Acute pain   OT Problem List: Decreased  strength;Decreased activity tolerance;Decreased range of motion;Impaired balance (sitting and/or standing);Decreased coordination;Decreased safety awareness;Decreased knowledge of use of DME or AE;Decreased knowledge of precautions;Pain   OT Treatment/Interventions: Self-care/ADL training;Therapeutic exercise;Energy conservation;DME and/or AE instruction;Therapeutic activities;Patient/family education;Balance training  OT Goals(Current goals can be found in the care plan section) Acute Rehab OT Goals Patient Stated Goal: go home soon OT Goal Formulation: With patient Time For Goal Achievement: 04/13/15 Potential to Achieve Goals: Good ADL Goals Pt Will Perform Grooming: with modified independence;standing Pt Will Perform Lower Body Bathing: with modified independence;sit to/from stand;with adaptive equipment Pt Will Perform Lower Body Dressing: with modified independence;sit to/from stand;with adaptive equipment Additional ADL Goal #1: Pt will be mod I with functional mobility and functional transfers using RW prn  OT Frequency: Min 2X/week   Barriers to D/C: none known at this tiem    End of Session Equipment Utilized During Treatment: Gait belt;Rolling walker  Activity Tolerance: Patient tolerated treatment well Patient left: in chair;with call bell/phone within reach   Time: [id number] OT Time Calculation (min): 18 min Charges:  OT General Charges $OT Visit: 1 Procedure OT Evaluation $OT Eval Low Complexity: 1 Procedure  Chrys Racer , MS, OTR/L, CLT Pager: [phone]  03/30/2015, 11:57 AM

## 2015-03-30 NOTE — Progress Notes (Signed)
Physical Therapy Treatment Patient Details Name: Rachel Trevino MRN: [id number] DOB: [birth year] Today's Date: 03/30/2015    History of Present Illness Pt s/p R THR; pt wtih hx of L LE lymphedema    PT Comments    Pt moving slowly but progressing well with mobility.  Follow Up Recommendations  Home health PT     Equipment Recommendations  None recommended by PT    Recommendations for Other Services OT consult     Precautions / Restrictions Precautions Precautions: Fall Restrictions Weight Bearing Restrictions: Yes RLE Weight Bearing: Weight bearing as tolerated Other Position/Activity Restrictions: WBAT    Mobility  Bed Mobility Overal bed mobility: Needs Assistance Bed Mobility: Supine to Sit     Supine to sit: Min assist;Mod assist     General bed mobility comments: Pt found seated in recliner upon OT entering/exiting room   Transfers Overall transfer level: Needs assistance Equipment used: Rolling walker (2 wheeled) Transfers: Sit to/from Stand Sit to Stand: Min guard         General transfer comment: cues for LE management and use of UEs to self assist  Ambulation/Gait Ambulation/Gait assistance: Min assist Ambulation Distance (Feet): 68 Feet Assistive device: Rolling walker (2 wheeled) Gait Pattern/deviations: Step-to pattern;Decreased step length - right;Decreased step length - left;Shuffle;Trunk flexed Gait velocity: decr   General Gait Details: Increased time with cues for sequence, posture and position from [employer]            Wheelchair Mobility    Modified Rankin (Stroke Patients Only)       Balance Overall balance assessment: Needs assistance Sitting-balance support: No upper extremity supported;Feet supported Sitting balance-Leahy Scale: Good     Standing balance support: Bilateral upper extremity supported;During functional activity Standing balance-Leahy Scale: Fair                      Cognition  Arousal/Alertness: Awake/alert Behavior During Therapy: WFL for tasks assessed/performed Overall Cognitive Status: Within Functional Limits for tasks assessed                      Exercises Total Joint Exercises Ankle Circles/Pumps: AROM;Both;15 reps;Supine Quad Sets: AROM;Both;10 reps;Supine Heel Slides: AAROM;Right;15 reps;Supine Hip ABduction/ADduction: AAROM;Right;10 reps;Supine    General Comments        Pertinent Vitals/Pain Pain Assessment: Faces Pain Score: 4  Faces Pain Scale: Hurts little more Pain Location: right hip with movement/mobility  Pain Descriptors / Indicators: Aching;Sore;Discomfort Pain Intervention(s): Limited activity within patient's tolerance;Monitored during session;Repositioned    Home Living Family/patient expects to be discharged to:: Private residence Living Arrangements: Spouse/significant other Available Help at Discharge: Family Type of Home: House Home Access: Stairs to enter Entrance Stairs-Rails: None Home Layout: One level Home Equipment: Environmental consultant - standard      Prior Function Level of Independence: Independent          PT Goals (current goals can now be found in the care plan section) Acute Rehab PT Goals Patient Stated Goal: go home soon PT Goal Formulation: With patient Time For Goal Achievement: 04/03/15 Potential to Achieve Goals: Good Progress towards PT goals: Progressing toward goals    Frequency  7X/week    PT Plan Current plan remains appropriate    Co-evaluation             End of Session Equipment Utilized During Treatment: Gait belt Activity Tolerance: Patient tolerated treatment well Patient left: in chair;with call bell/phone within reach  Time: [id number] PT Time Calculation (min) (ACUTE ONLY): 38 min  Charges:  $Gait Training: 23-37 mins $Therapeutic Exercise: 8-22 mins                    G Codes:      Maylene Crocker 13-Apr-2015, 12:43 PM

## 2015-03-30 NOTE — Progress Notes (Signed)
Physical Therapy Treatment Patient Details Name: Rachel Trevino MRN: [id number] DOB: 1950/08/02 Today's Date: 2015/04/04    History of Present Illness Pt s/p R THR; pt wtih hx of L LE lymphedema    PT Comments    Pt progressing well with mobility.  Follow Up Recommendations  Home health PT     Equipment Recommendations  None recommended by PT    Recommendations for Other Services OT consult     Precautions / Restrictions Precautions Precautions: Fall Restrictions Weight Bearing Restrictions: No RLE Weight Bearing: Weight bearing as tolerated    Mobility  Bed Mobility Overal bed mobility: Needs Assistance Bed Mobility: Sit to Supine       Sit to supine: Min assist   General bed mobility comments: cues for sequence and use of  L LE to self assist  Transfers Overall transfer level: Needs assistance Equipment used: Rolling walker (2 wheeled) Transfers: Sit to/from Stand Sit to Stand: Min guard         General transfer comment: cues for LE management and use of UEs to self assist  Ambulation/Gait Ambulation/Gait assistance: Min guard;Supervision Ambulation Distance (Feet): 147 Feet Assistive device: Rolling walker (2 wheeled) Gait Pattern/deviations: Step-to pattern;Decreased step length - right;Decreased step length - left;Shuffle;Trunk flexed Gait velocity: decr   General Gait Details: Increased time with cues for sequence, posture and position from RW   Stairs Stairs: Yes Stairs assistance: Min assist Stair Management: No rails;Step to pattern;Backwards;With walker Number of Stairs: 2 General stair comments: cues for sequence and foot/RW placement  Wheelchair Mobility    Modified Rankin (Stroke Patients Only)       Balance                                    Cognition Arousal/Alertness: Awake/alert Behavior During Therapy: WFL for tasks assessed/performed Overall Cognitive Status: Within Functional Limits for tasks  assessed                      Exercises      General Comments        Pertinent Vitals/Pain Pain Assessment: 0-10 Pain Score: 4  Pain Location: R hip Pain Descriptors / Indicators: Aching;Sore Pain Intervention(s): Limited activity within patient's tolerance;Monitored during session;Premedicated before session;Ice applied    Home Living                      Prior Function            PT Goals (current goals can now be found in the care plan section) Acute Rehab PT Goals Patient Stated Goal: go home soon PT Goal Formulation: With patient Time For Goal Achievement: 04/03/15 Potential to Achieve Goals: Good Progress towards PT goals: Progressing toward goals    Frequency  7X/week    PT Plan Current plan remains appropriate    Co-evaluation             End of Session Equipment Utilized During Treatment: Gait belt Activity Tolerance: Patient tolerated treatment well Patient left: in bed;with call bell/phone within reach;with family/visitor present     Time: [id number] PT Time Calculation (min) (ACUTE ONLY): 26 min  Charges:  $Gait Training: 23-37 mins                    G Codes:      Rachel Trevino 04/04/2015, 5:47 PM

## 2015-03-30 NOTE — Op Note (Signed)
NAMEMarland Kitchen  Rachel Trevino, Rachel Trevino NO.:  [id number]  MEDICAL RECORD NO.:  [id number]  LOCATION:  12                         FACILITY:  [facility]  PHYSICIAN:  Lind Guest. 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  PROCEDURE:  Right total hip arthroplasty through direct anterior approach.  IMPLANTS:  DePuy Sector Gription acetabular component size 50, size 32+ 0 neutral polyethylene liner, size 12 Corail femoral component with standard offset, size 32+ 1 ceramic hip ball.  SURGEON:  Lind Guest. Ninfa Linden, M.D.  ASSISTANT:  Erskine Emery, PA-C  ANESTHESIA:  Spinal.  ANTIBIOTICS:  2 of g IV Ancef.  BLOOD LOSS:  100 mL.  COMPLICATIONS:  None.  INDICATIONS:  Ms. Czap is a 65 year old female with debilitating arthritis involving her right hip.  She has tried and failed all forms of conservative treatment and has been slowly getting worse over several years now.  It is greatly and detrimentally affected her activities of daily living, her quality of life and her mobility.  Her x-ray showed complete loss of her joint space, sclerotic and cystic changes.  At this point, we have recommended a total hip arthroplasty.  She understands our goals are decreased pain, improved mobility, and overall improved quality of life.  She understands the risk of acute blood loss anemia, nerve and vessel injury, fracture, infection, dislocation, and DVT.  PROCEDURE DESCRIPTION:  After informed consent was obtained, appropriate right hip was marked.  She was brought to the operating room where spinal anesthesia was obtained.  She was then laid in a supine position and a Foley catheter was placed and  then both feet had traction boots applied to them.  Next, she was placed supine on the Hana fracture table with the perineal post in place and both legs in inline skeletal traction devices, but no traction applied.  Her right operative hip was then prepped and draped with DuraPrep and sterile drapes.  Time-out was called and she was identified as correct patient and correct right hip. We then made an incision inferior and posterior to the anterior superior iliac spine and carried this obliquely down the leg.  We dissected down the tensor fascia lata muscle and the tensor fascia was then divided longitudinally, so we could proceed with a direct anterior approach to the hip.  We identified and cauterized the lateral femoral circumflex vessels and then identified the hip capsule.  We opened up the hip capsule in L-type format finding a large joint effusion and found the hip devoid of cartilage.  We then placed Cobra retractors around the medial and lateral femoral neck and made our femoral neck cut with an oscillating saw proximal to the lesser trochanter and completed this with an osteotome.  I placed a corkscrew guide in the femoral head and removed the femoral head its entirety and we found it to be devoid of cartilage.  We then cleaned the acetabular remnants of the acetabular  labrum and released the transverse acetabular ligament.  I placed the bent Hohmann over the medial acetabular rim and then began reaming under direct visualization from a size 42 reamer up to size 50 with all reamers under direct visualization, the last reamer also under direct fluoroscopy, so I could obtain my depth of reaming, our inclination, and anteversion.  Once I was done with this, I placed the real DePuy Sector Gription acetabular component size 50, a single screw, and a 32+ 0 neutral polyethylene liner for that size acetabular component. Attention was then turned to the femur.  With the leg externally  rotated to 100 degrees, extended and adducted, we were able to place a Mueller retractor medially and a Hohmann retractor behind the greater trochanter.  We released the lateral joint capsule and used a box cutting osteotome to enter the femoral canal and a rongeur to lateralize.  We then began broaching from size 8 broach all the way up to a size 12.  With the size 12 in place, we trialed a standard neck and a 32+ 1 hip ball and brought the leg back over and up with traction and internal rotation, reducing the pelvis, and we were pleased with stability.  I felt like her leg lengths were just a little bit longer, but I could not bring the broach more enough that it was shorter she would run the risk of instability.  We dislocated the hip and removed the trial components.  I then placed the real size 12 Corail femoral component with standard offset, the real 32+ 1 ceramic hip ball.  We reduced this in the acetabulum and it was stable.  We then copiously irrigated the soft tissue with normal saline solution using pulsatile lavage.  We closed the joint capsule with interrupted #1 Ethibond suture followed by running #1 Vicryl in the tensor fascia, 0 Vicryl in the deep tissue, 2-0 Vicryl in the subcutaneous tissue, 4-0 Monocryl subcuticular stitch and Steri-Strips on the skin.  An Aquacel dressing was applied. She was taken off the Hana table and taken to the recovery room in stable condition.  All final counts were correct.  There were no complications noted.  Of note, Erskine Emery, PA-C assisted during the entire case and his assistance was crucial for facilitating all aspects of this case.     Lind Guest. Ninfa Linden, M.D.     CYB/MEDQ  D:  03/29/2015  T:  03/30/2015  Job:  [id number]

## 2015-03-31 NOTE — Care Management Note (Signed)
Case Management Note  Patient Details  Name: Rachel Trevino MRN: [id number] Date of Birth: 1950-11-12  Subjective/Objective:                  R THR  Action/Plan: CM spoke with patient. 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 She states she does have wheels on the walker she has at home. Jermaine at [facility] notified of the DME request and discharge date of today.  Expected Discharge Date:   03/31/15           Expected Discharge Plan:  [place]  In-House Referral:  NA  Discharge planning Services     Post Acute Care Choice:   Choice offered to:    DME Arranged:  3-N-1 DME Agency:  [place]:    [place]:     Status of Service:  Completed, signed off  Medicare Important Message Given:    Date Medicare IM Given:    Medicare IM give by:    Date Additional Medicare IM Given:    Additional Medicare Important Message give by:     If discussed at [place] of Stay Meetings, dates discussed:    Additional Comments:  Apolonio Schneiders, RN 03/31/2015, 2:19 PM

## 2015-03-31 NOTE — Progress Notes (Signed)
Subjective: Pt stable - moving well   Objective: Vital signs in last 24 hours: Temp:  [98.4 F (36.9 C)-99 F (37.2 C)] 99 F (37.2 C) (01/15 0701) Pulse Rate:  [81-84] 81 (01/15 0701) Resp:  [16] 16 (01/14 2158) BP: (128-139)/(63-69) 128/69 mmHg (01/15 0701) SpO2:  [99 %-100 %] 99 % (01/15 0701)  Intake/Output from previous day: 01/14 0701 - 01/15 0700 In: 1080 [P.O.:1080] Out: -  Intake/Output this shift: Total I/O In: 240 [P.O.:240] Out: -   Exam:  Dorsiflexion/Plantar flexion intact  Labs:  Recent Labs  03/30/15 0430  HGB 11.7*    Recent Labs  03/30/15 0430  WBC 17.4*  RBC 4.20  HCT 33.5*  PLT 327    Recent Labs  03/30/15 0430  NA 137  K 4.5  CL 101  CO2 26  BUN 11  CREATININE 0.59  GLUCOSE 136*  CALCIUM 9.0   No results for input(s): LABPT, INR in the last 72 hours.  Assessment/Plan: Plan dc today   DEAN,GREGORY SCOTT 03/31/2015, 1:50 PM

## 2015-03-31 NOTE — Progress Notes (Signed)
Physical Therapy Treatment Patient Details Name: PEGI HEIBERGER MRN: [id number] DOB: 12-14-1950 Today's Date: 03/31/2015    History of Present Illness Pt s/p R THR; pt wtih hx of L LE lymphedema    PT Comments    Pt progressing well and expressing desire to return home this date.  Reviewed therex, car transfers and stairs.  Follow Up Recommendations  Home health PT     Equipment Recommendations  None recommended by PT    Recommendations for Other Services OT consult     Precautions / Restrictions Precautions Precautions: Fall Restrictions Weight Bearing Restrictions: No RLE Weight Bearing: Weight bearing as tolerated    Mobility  Bed Mobility Overal bed mobility: Needs Assistance Bed Mobility: Sit to Supine       Sit to supine: Min guard   General bed mobility comments: cues for sequence and use of  L LE to self assist  Transfers Overall transfer level: Modified independent Equipment used: None Transfers: Sit to/from Stand Sit to Stand: Modified independent (Device/Increase time)            Ambulation/Gait Ambulation/Gait assistance: Supervision Ambulation Distance (Feet): 150 Feet Assistive device: Rolling walker (2 wheeled) Gait Pattern/deviations: Step-to pattern;Decreased step length - right;Decreased step length - left;Shuffle;Trunk flexed Gait velocity: decr Gait velocity interpretation: Below normal speed for age/gender General Gait Details: Increased time with min cues for sequence, posture and position from RW   Stairs Stairs: Yes Stairs assistance: Min assist Stair Management: No rails;Step to pattern;Backwards;With walker Number of Stairs: 4 General stair comments: 2 steps twice with son assisting on second attempt.  Cues for sequence and foot/RW placement  Wheelchair Mobility    Modified Rankin (Stroke Patients Only)       Balance                                    Cognition Arousal/Alertness:  Awake/alert Behavior During Therapy: WFL for tasks assessed/performed Overall Cognitive Status: Within Functional Limits for tasks assessed                      Exercises Total Joint Exercises Ankle Circles/Pumps: AROM;Both;15 reps;Supine Quad Sets: AROM;Both;10 reps;Supine Heel Slides: AAROM;Right;Supine;20 reps Hip ABduction/ADduction: AAROM;Right;Supine;15 reps    General Comments        Pertinent Vitals/Pain Pain Assessment: 0-10 Pain Score: 4  Pain Location: R hip Pain Descriptors / Indicators: Aching;Sore Pain Intervention(s): Limited activity within patient's tolerance;Monitored during session;Premedicated before session;Ice applied    Home Living                      Prior Function            PT Goals (current goals can now be found in the care plan section) Acute Rehab PT Goals Patient Stated Goal: go home soon PT Goal Formulation: With patient Time For Goal Achievement: 04/03/15 Potential to Achieve Goals: Good Progress towards PT goals: Progressing toward goals    Frequency  7X/week    PT Plan Current plan remains appropriate    Co-evaluation             End of Session Equipment Utilized During Treatment: Gait belt Activity Tolerance: Patient tolerated treatment well Patient left: in bed;with call bell/phone within reach;with family/visitor present     Time: [id number] PT Time Calculation (min) (ACUTE ONLY): 38 min  Charges:  $Gait Training: 8-22 mins $Therapeutic Exercise:  8-22 mins $Therapeutic Activity: 8-22 mins                    G Codes:      Kelsa Jaworowski 2015/04/09, 12:57 PM

## 2015-03-31 NOTE — Progress Notes (Signed)
Discharged from floor via w/c, belongings & family with pt. No changes in assessment. Rachel Trevino  

## 2015-03-31 NOTE — Progress Notes (Signed)
Occupational Therapy Treatment Patient Details Name: Rachel Trevino MRN: [id number] DOB: [birth year] Today's Date: 03/31/2015    History of present illness Pt s/p R THR; pt wtih hx of L LE lymphedema   OT comments  Pt at adequate level for dc home from OT standpoint. All education is complete and patient indicates understanding. Pt will have spouse 24/7 as needed ( he is retired)   Follow Up Recommendations  No OT follow up;Supervision - Intermittent    Equipment Recommendations  3 in 1 bedside comode    Recommendations for Other Services      Precautions / Restrictions Precautions Precautions: Fall Restrictions RLE Weight Bearing: Weight bearing as tolerated       Mobility Bed Mobility Overal bed mobility: Modified Independent                Transfers Overall transfer level: Modified independent                    Balance           Standing balance support: No upper extremity supported;During functional activity Standing balance-Leahy Scale: Normal                     ADL Overall ADL's : Modified independent                     Lower Body Dressing: Modified independent Lower Body Dressing Details (indicate cue type and reason): educated on Lb dresing with reacher. Pt informed of how to purchase reacher in community and gift shop. Pt also has spouse (A) upon d/c home.              Functional mobility during ADLs: Supervision/safety;Rolling walker General ADL Comments: pt completed full adl at sink level dynamic standing. pt dressed in home clothing. pt progressing well and adequate level for dc home from [place] During Therapy: [facility] for tasks assessed/performed Overall Cognitive Status: Within Functional Limits for tasks assessed                       Extremity/Trunk Assessment               Exercises     Shoulder Instructions       General Comments      Pertinent Vitals/ Pain       Pain Assessment: 0-10 Pain Score: 3  Pain Location: R HIP Pain Descriptors / Indicators: Discomfort Pain Intervention(s): Premedicated before session;Monitored during session;Repositioned  Home Living                                          Prior Functioning/Environment              Frequency       Progress Toward Goals  OT Goals(current goals can now be found in the care plan section)  Progress towards OT goals: Goals met/education completed, patient discharged from OT  Acute Rehab OT Goals Patient Stated Goal: go home soon OT Goal Formulation: With patient Time For Goal Achievement: 04/13/15 Potential to Achieve Goals: Good ADL Goals Pt Will  Perform Grooming: with modified independence;standing Pt Will Perform Lower Body Bathing: with modified independence;sit to/from stand;with adaptive equipment Pt Will Perform Lower Body Dressing: with modified independence;sit to/from stand;with adaptive equipment Additional ADL Goal #1: Pt will be mod I with functional mobility and functional transfers using RW prn  Plan All goals met and education completed, patient discharged from OT services    Co-evaluation                 End of Session Equipment Utilized During Treatment: Gait belt;Rolling walker   Activity Tolerance Patient tolerated treatment well   Patient Left in chair;with call bell/phone within reach   Nurse Communication Mobility status;Precautions        Time: 0730-0805 OT Time Calculation (min): 35 min  Charges:    [place],  B 03/31/2015, 8:21 AM   Nicastro, Brynn   OTR/L Pager: [phone] Office: [phone] .   

## 2015-04-04 NOTE — Discharge Summary (Signed)
Patient ID: Rachel Trevino MRN: [id number] DOB/AGE: 67-Mar-1952 67 y.o.  Admit date: 03/29/2015 Discharge date: 04/04/2015  Admission Diagnoses:  Principal Problem:   Osteoarthritis of right hip Active Problems:   Status post total replacement of right hip   Discharge Diagnoses:  Same  Past Medical History  Diagnosis Date  . Cough with expectoration  since 03-20-14    white to yeloow mucous  . Lymphedema 2012    left leg  . 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 Arthritis     oa  . Complication of anesthesia 2012    needed fiber optic,  scope for good vocal cord view, see letter from [facility]  on chart    Surgeries: Procedure(s): RIGHT TOTAL HIP Early on 03/29/2015   Consultants:    Discharged Condition: Improved  Hospital Course: Rachel Trevino is an 65 y.o. female who was admitted 03/29/2015 for operative treatment ofOsteoarthritis of right hip. Patient has severe unremitting pain that affects sleep, daily activities, and work/hobbies. After pre-op clearance the patient was taken to the operating room on 03/29/2015 and underwent  Procedure(s): RIGHT TOTAL HIP ARTHROPLASTY ANTERIOR APPROACH.    Patient was given perioperative antibiotics:  Anti-infectives    Start     Dose/Rate Route Frequency Ordered Stop   03/29/15 1800  ceFAZolin (ANCEF) IVPB 1 g/50 mL premix     1 g 100 mL/hr over 30 Minutes Intravenous Every 6 hours 03/29/15 1406 03/30/15 0025   03/29/15 0948  ceFAZolin (ANCEF) IVPB 2 g/50 mL premix     2 g 100 mL/hr over 30 Minutes Intravenous On call to O.R. 03/29/15 0948 03/29/15 1129       Patient was given sequential compression devices, early ambulation, and chemoprophylaxis to prevent DVT.  Patient benefited maximally from hospital stay and there were no complications.    Recent vital signs: No data found.    Recent laboratory studies: No results for input(s): WBC, HGB, HCT, PLT, NA, K, CL, CO2, BUN,  CREATININE, GLUCOSE, INR, CALCIUM in the last 72 hours.  Invalid input(s): PT, 2   Discharge Medications:     Medication List    STOP taking these medications        aspirin 81 MG chewable tablet  Replaced by:  aspirin 325 MG EC tablet     ibuprofen 200 MG tablet  Commonly known as:  ADVIL,MOTRIN      TAKE these medications        acetaminophen 650 MG CR tablet  Commonly known as:  TYLENOL  Take 650 mg by mouth every 8 (eight) hours as needed for pain.     aspirin 325 MG EC tablet  Take 1 tablet (325 mg total) by mouth 2 (two) times daily after a meal.     CALCIUM-MAGNESIUM-ZINC PO  Take 1 tablet by mouth daily.     Fish Oil 1000 MG Caps  Take 1,000 mg by mouth daily.     GLUCOSAMINE-MSM PO  Take 1 tablet by mouth daily.     guaiFENesin 600 MG 12 hr tablet  Commonly known as:  MUCINEX  Take by mouth 2 (two) times daily.     methocarbamol 500 MG tablet  Commonly known as:  ROBAXIN  Take 1 tablet (500 mg total) by mouth every 6 (six) hours as needed for muscle spasms.     multivitamin Liqd  Take 5 mLs by mouth daily.     naproxen 375  MG tablet  Commonly known as:  NAPROSYN  Take 375 mg by mouth 2 (two) times daily as needed (pain).     OVER THE COUNTER MEDICATION  Apply 1 spray topically 4 (four) times daily as needed (pain). cryoderm spray     oxyCODONE 5 MG immediate release tablet  Commonly known as:  Oxy IR/ROXICODONE  Take 1-2 tablets (5-10 mg total) by mouth every 4 (four) hours as needed for severe pain or breakthrough pain.     TRAVATAN Z 0.004 % Soln ophthalmic solution  Generic drug:  Travoprost (BAK Free)  INSTILL 1 DROP INTO EACH  EYE ONCE DAILY IN THE EVENING     TURMERIC PO  Take by mouth. Once very 3 days     VITAMIN D (CHOLECALCIFEROL) PO  Take 1 tablet by mouth daily.        Diagnostic Studies: Dg C-arm 1-60 Min-no Report  03/29/2015  CLINICAL DATA: right hip surgery C-ARM 1-60 MINUTES Fluoroscopy was utilized by the requesting  physician.  No radiographic interpretation.   Dg Hip Port Unilat With Pelvis 1v Right  03/29/2015  CLINICAL DATA:  Status post right hip arthroplasty. EXAM: DG HIP (WITH OR WITHOUT PELVIS) 1V PORT RIGHT COMPARISON:  Current operative images. FINDINGS: The femoral and acetabular components of the right hip arthroplasty are well-seated and aligned. No acute fracture or evidence of an operative complication. IMPRESSION: Well-aligned total right hip arthroplasty. Electronically Signed   By: Lajean Manes M.D.   On: 03/29/2015 13:26   Dg Hip Operative Unilat With Pelvis Right  03/29/2015  CLINICAL DATA:  Right total hip replacement. Fluoroscopy time is 4 minutes. EXAM: DG C-ARM 1-60 MIN-NO REPORT; OPERATIVE RIGHT HIP WITH PELVIS COMPARISON:  None. FINDINGS: Four images are submitted demonstrating preoperative and postoperative images of the right hip. Significant degenerative changes in the right hip are noted prior to right total hip arthroplasty. On frontal views, the femoral prosthesis is noted to be within the acetabular prosthesis without fracture dislocation. IMPRESSION: Status post right total hip arthroplasty. Electronically Signed   By: Nolon Nations M.D.   On: 03/29/2015 12:44    Disposition: 06-Home-Health Care Svc      Discharge Instructions    Call MD / Call 911    Complete by:  As directed   If you experience chest pain or shortness of breath, CALL 911 and be transported to the hospital emergency room.  If you develope a fever above 101 F, pus (white drainage) or increased drainage or redness at the wound, or calf pain, call your surgeon's office.     Constipation Prevention    Complete by:  As directed   Drink plenty of fluids.  Prune juice may be helpful.  You may use a stool softener, such as Colace (over the counter) 100 mg twice a day.  Use MiraLax (over the counter) for constipation as needed.     Diet - low sodium heart healthy    Complete by:  As directed      Increase  activity slowly as tolerated    Complete by:  As directed            Follow-up Information    Follow up with Mcarthur Rossetti, MD In 2 weeks.   Specialty:  Orthopedic Surgery   Contact information:   [place] [address] [phone]       Follow up with [facility].   Contact information:   [address]  [place] [address] [phone]        Signed: Mcarthur Rossetti 04/04/2015, 8:39 AM

## 2016-05-04 ENCOUNTER — Encounter (INDEPENDENT_AMBULATORY_CARE_PROVIDER_SITE_OTHER): Payer: Self-pay

## 2016-05-04 ENCOUNTER — Ambulatory Visit (INDEPENDENT_AMBULATORY_CARE_PROVIDER_SITE_OTHER): Payer: Medicare Other

## 2016-05-04 ENCOUNTER — Ambulatory Visit (INDEPENDENT_AMBULATORY_CARE_PROVIDER_SITE_OTHER): Payer: Medicare Other | Admitting: Orthopaedic Surgery

## 2016-05-04 DIAGNOSIS — M25551 Pain in right hip: Secondary | ICD-10-CM

## 2016-05-04 DIAGNOSIS — Z96641 Presence of right artificial hip joint: Secondary | ICD-10-CM

## 2016-05-04 NOTE — Progress Notes (Signed)
Rachel Trevino is 13 months out from a right total hip arthroplasty through direct injury approach. She has some occasional aches and pains and sometimes it feels like a pulled muscle but overall she is doing well. She doesn't walk with any assistive device. She denies any groin pain. She denies any leg length discrepancy.  On examination of her right hip her exam is entirely normal today. There is no pain with internal/external rotation or flexion extension. His neck pain when stressing the hip. Her leg lengths are equal. New very of an AP pelvis shows a well-seated implant with no, getting features no evidence of loosening.  At this point she'll continue increase her activities as tolerates. She'll take anti-inflammatories as needed. I talked her in detail about things need to bring her back for that hip. We can always see her for anything else

## 2016-10-14 ENCOUNTER — Other Ambulatory Visit (HOSPITAL_COMMUNITY)
Admission: RE | Admit: 2016-10-14 | Discharge: 2016-10-14 | Disposition: A | Discharge status: Home / Self Care | Payer: Medicare Other | Source: Ambulatory Visit | Attending: Family Medicine | Admitting: Family Medicine

## 2016-10-14 ENCOUNTER — Other Ambulatory Visit: Payer: Self-pay | Admitting: Family Medicine

## 2016-10-14 DIAGNOSIS — Z01411 Encounter for gynecological examination (general) (routine) with abnormal findings: Secondary | ICD-10-CM | POA: Diagnosis present

## 2016-10-19 LAB — CYTOLOGY - PAP
HPV 16/18/45 genotyping: NEGATIVE
HPV: DETECTED — AB

## 2016-11-04 ENCOUNTER — Other Ambulatory Visit: Payer: Self-pay | Admitting: Obstetrics and Gynecology

## 2017-01-27 ENCOUNTER — Encounter: Payer: Self-pay | Admitting: Endocrinology

## 2017-01-27 ENCOUNTER — Ambulatory Visit: Payer: Medicare Other | Admitting: Endocrinology

## 2017-01-27 DIAGNOSIS — E042 Nontoxic multinodular goiter: Secondary | ICD-10-CM

## 2017-01-27 DIAGNOSIS — Z23 Encounter for immunization: Secondary | ICD-10-CM | POA: Diagnosis not present

## 2017-01-27 DIAGNOSIS — E0590 Thyrotoxicosis, unspecified without thyrotoxic crisis or storm: Secondary | ICD-10-CM | POA: Insufficient documentation

## 2017-01-27 MED ORDER — METHIMAZOLE 5 MG PO TABS: 5.0000 mg | ORAL_TABLET | Freq: Every day | ORAL | 5 refills | Status: DC

## 2017-01-27 NOTE — Progress Notes (Signed)
Subjective:    Patient ID: Rachel Trevino, female    DOB: Apr 12, 1950, 66 y.o.   MRN: [id number]  HPI Pt is referred by Dr Dorthy Cooler, for hyperthyroidism.  Pt reports she was dx'ed with multinodular goiter in 2012, and hyperthyroidism in 2018.  she has never been on therapy for this.  she has never had XRT to the anterior neck, or thyroid surgery.  she does not consume kelp or any non-prescribed thyroid medication.  she has never been on amiodarone.  She reports slight fatigue, but no assoc palpitations.   Past Medical History:  Diagnosis Date  . Arthritis    oa  . 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 Complication of anesthesia 2012   needed fiber optic,  scope for good vocal cord view, see letter from [facility]  on chart  . Cough with expectoration  since 03-20-14   white to yeloow mucous  . Lymphedema 2012   left leg    Past Surgical History:  Procedure Laterality Date  . APPENDECTOMY    . left leg surgery for sarcoma  2012  . RIGHT TOTAL HIP ARTHROPLASTY ANTERIOR APPROACH Right 03/29/2015   Performed by Mcarthur Rossetti, MD at [facility]  . TONSILLECTOMY    . TUBAL LIGATION      Social History   Socioeconomic History  . Marital status: Married    Spouse name: Not on file  . Number of children: Not on file  . Years of education: Not on file  . Highest education level: Not on file  Social Needs  . Financial resource strain: Not on file  . Food insecurity - worry: Not on file  . Food insecurity - inability: Not on file  . Transportation needs - medical: Not on file  . Transportation needs - non-medical: Not on file  Occupational History  . Not on file  Tobacco Use  . Smoking status: Never Smoker  . Smokeless tobacco: Never Used  Substance and Sexual Activity  . Alcohol use: No  . Drug use: No  . Sexual activity: Not on file  Other Topics Concern  . Not on file  Social History Narrative  . Not on file    Current Outpatient Medications on  File Prior to Visit  Medication Sig Dispense Refill  . acetaminophen (TYLENOL) 650 MG CR tablet Take 650 mg by mouth every 8 (eight) hours as needed for pain.    Marland Kitchen aspirin EC 325 MG EC tablet Take 1 tablet (325 mg total) by mouth 2 (two) times daily after a meal. 60 tablet 0  . CALCIUM-MAGNESIUM-ZINC PO Take 1 tablet by mouth daily.    . Ginkgo Biloba 40 MG TABS Take 40 mg by mouth.    . Multiple Vitamin (MULTIVITAMIN) LIQD Take 5 mLs by mouth daily.    . Omega-3 Fatty Acids (FISH OIL) 1000 MG CAPS Take 1,000 mg by mouth daily.    Marland Kitchen oxyCODONE (OXY IR/ROXICODONE) 5 MG immediate release tablet Take 1-2 tablets (5-10 mg total) by mouth every 4 (four) hours as needed for severe pain or breakthrough pain. 60 tablet 0  . TRAVATAN Z 0.004 % SOLN ophthalmic solution INSTILL 1 DROP INTO EACH  EYE ONCE DAILY IN THE EVENING  12  . TURMERIC PO Take by mouth. Once very 3 days    . VITAMIN D, CHOLECALCIFEROL, PO Take 1 tablet by mouth daily.    . Glucosamine HCl-MSM (GLUCOSAMINE-MSM PO) Take 1 tablet  by mouth daily.    Marland Kitchen guaiFENesin (MUCINEX) 600 MG 12 hr tablet Take by mouth 2 (two) times daily.    . methocarbamol (ROBAXIN) 500 MG tablet Take 1 tablet (500 mg total) by mouth every 6 (six) hours as needed for muscle spasms. (Patient not taking: Reported on 01/27/2017) 60 tablet 0  . naproxen (NAPROSYN) 375 MG tablet Take 375 mg by mouth 2 (two) times daily as needed (pain).   0  . OVER THE COUNTER MEDICATION Apply 1 spray topically 4 (four) times daily as needed (pain). cryoderm spray     No current facility-administered medications on file prior to visit.     No Known Allergies  Family History  Problem Relation Age of Onset  . Thyroid disease Neg Hx     BP 128/78 (BP Location: Left Arm, Patient Position: Sitting, Cuff Size: Normal)   Pulse 61   Wt 178 lb 6.4 oz (80.9 kg)   SpO2 98%   BMI 30.62 kg/m     Review of Systems denies weight loss, headache, hoarseness, visual loss, sob, diarrhea,  polyuria, muscle weakness, edema, excessive diaphoresis, tremor, anxiety, heat intolerance, easy bruising, and rhinorrhea.      Objective:   Physical Exam VS: see vs page GEN: no distress HEAD: head: no deformity eyes: no periorbital swelling, no proptosis external nose and ears are normal mouth: no lesion seen NECK: I can palpate a 2 cm right thyroid nodule CHEST WALL: no deformity LUNGS: clear to auscultation CV: reg rate and rhythm, no murmur ABD: abdomen is soft, nontender.  no hepatosplenomegaly.  not distended.  no hernia MUSCULOSKELETAL: muscle bulk and strength are grossly normal.  no obvious joint swelling.  gait is normal and steady EXTEMITIES: no deformity.  no edema PULSES: no carotid bruit NEURO:  cn 2-12 grossly intact.   readily moves all 4's.  sensation is intact to touch on all 4's.  No tremor SKIN:  Normal texture and temperature.  No rash or suspicious lesion is visible.  Not diaphoretic NODES:  None palpable at the neck PSYCH: alert, well-oriented.  Does not appear anxious nor depressed.  I have reviewed outside records, and summarized: Pt was noted to have suppressed TSH, and referred here.  She was seen for AWV, and several other probs were addressed.   outside test results are reviewed: TSH=0.15  [place] (2012): Left lobe: Multiple nodules some of which are predominantly cystic and others of which are predominantly solid. Mixed cystic and solid lesion in the midpole of the left thyroid lobe anteriorly with bulky internal calcifications causing posterior acoustic shadowing. The lesion measures approximately 0.5 x 0.7 x 0.9 cm. Right lobe: Multiple nodules some of which are predominantly cystic and others of which are predominantly Solid.  Thyroid bxs, bilat (2012) Left Thyroid: benign follicular nodule.     Assessment & Plan:  Hyperthyroidism, new, due to multinodular goiter.  We discussed rx options.  She chooses tapazole rx.    Patient Instructions    Let's recheck the ultrasound.  you will receive a phone call, about a day and time for an appointment. I have sent a prescription to your pharmacy, to slow the thyroid. Please come back for a follow-up appointment in 2 months.  If you change your mind, we can do the radioactive iodine pill in the future.         Hyperthyroidism Hyperthyroidism is when the thyroid is too active (overactive). Your thyroid is a large gland that is located in your neck.  The thyroid helps to control how your body uses food (metabolism). When your thyroid is overactive, it produces too much of a hormone called thyroxine. What are the causes? Causes of hyperthyroidism may include:  Graves disease. This is when your immune system attacks the thyroid gland. This is the most common cause.  Inflammation of the thyroid gland.  Tumor in the thyroid gland or somewhere else.  Excessive use of thyroid medicines, including: ? Prescription thyroid supplement. ? Herbal supplements that mimic thyroid hormones.  Solid or fluid-filled lumps within your thyroid gland (thyroid nodules).  Excessive ingestion of iodine.  What increases the risk?  Being female.  Having a family history of thyroid conditions. What are the signs or symptoms? Signs and symptoms of hyperthyroidism may include:  Nervousness.  Inability to tolerate heat.  Unexplained weight loss.  Diarrhea.  Change in the texture of hair or skin.  Heart skipping beats or making extra beats.  Rapid heart rate.  Loss of menstruation.  Shaky hands.  Fatigue.  Restlessness.  Increased appetite.  Sleep problems.  Enlarged thyroid gland or nodules.  How is this diagnosed? Diagnosis of hyperthyroidism may include:  Medical history and physical exam.  Blood tests.  Ultrasound tests.  How is this treated? Treatment may include:  Medicines to control your thyroid.  Surgery to remove your thyroid.  Radiation therapy.  Follow  these instructions at home:  Take medicines only as directed by your health care provider.  Do not use any tobacco products, including cigarettes, chewing tobacco, or electronic cigarettes. If you need help quitting, ask your health care provider.  Do not exercise or do physical activity until your health care provider approves.  Keep all follow-up appointments as directed by your health care provider. This is important. Contact a health care provider if:  Your symptoms do not get better with treatment.  You have fever.  You are taking thyroid replacement medicine and you: ? Have depression. ? Feel mentally and physically slow. ? Have weight gain. Get help right away if:  You have decreased alertness or a change in your awareness.  You have abdominal pain.  You feel dizzy.  You have a rapid heartbeat.  You have an irregular heartbeat. This information is not intended to replace advice given to you by your health care provider. Make sure you discuss any questions you have with your health care provider. Document Released: 03/02/2005 Document Revised: 08/01/2015 Document Reviewed: 07/18/2013 Elsevier Interactive Patient Education  2017 [employer].

## 2017-01-27 NOTE — Patient Instructions (Addendum)
Let's recheck the ultrasound.  you will receive a phone call, about a day and time for an appointment. I have sent a prescription to your pharmacy, to slow the thyroid. Please come back for a follow-up appointment in 2 months.  If you change your mind, we can do the radioactive iodine pill in the future.         Hyperthyroidism Hyperthyroidism is when the thyroid is too active (overactive). Your thyroid is a large gland that is located in your neck. The thyroid helps to control how your body uses food (metabolism). When your thyroid is overactive, it produces too much of a hormone called thyroxine. What are the causes? Causes of hyperthyroidism may include:  Graves disease. This is when your immune system attacks the thyroid gland. This is the most common cause.  Inflammation of the thyroid gland.  Tumor in the thyroid gland or somewhere else.  Excessive use of thyroid medicines, including: ? Prescription thyroid supplement. ? Herbal supplements that mimic thyroid hormones.  Solid or fluid-filled lumps within your thyroid gland (thyroid nodules).  Excessive ingestion of iodine.  What increases the risk?  Being female.  Having a family history of thyroid conditions. What are the signs or symptoms? Signs and symptoms of hyperthyroidism may include:  Nervousness.  Inability to tolerate heat.  Unexplained weight loss.  Diarrhea.  Change in the texture of hair or skin.  Heart skipping beats or making extra beats.  Rapid heart rate.  Loss of menstruation.  Shaky hands.  Fatigue.  Restlessness.  Increased appetite.  Sleep problems.  Enlarged thyroid gland or nodules.  How is this diagnosed? Diagnosis of hyperthyroidism may include:  Medical history and physical exam.  Blood tests.  Ultrasound tests.  How is this treated? Treatment may include:  Medicines to control your thyroid.  Surgery to remove your thyroid.  Radiation therapy.  Follow  these instructions at home:  Take medicines only as directed by your health care provider.  Do not use any tobacco products, including cigarettes, chewing tobacco, or electronic cigarettes. If you need help quitting, ask your health care provider.  Do not exercise or do physical activity until your health care provider approves.  Keep all follow-up appointments as directed by your health care provider. This is important. Contact a health care provider if:  Your symptoms do not get better with treatment.  You have fever.  You are taking thyroid replacement medicine and you: ? Have depression. ? Feel mentally and physically slow. ? Have weight gain. Get help right away if:  You have decreased alertness or a change in your awareness.  You have abdominal pain.  You feel dizzy.  You have a rapid heartbeat.  You have an irregular heartbeat. This information is not intended to replace advice given to you by your health care provider. Make sure you discuss any questions you have with your health care provider. Document Released: 03/02/2005 Document Revised: 08/01/2015 Document Reviewed: 07/18/2013 Elsevier Interactive Patient Education  2017 [employer].

## 2017-02-10 ENCOUNTER — Ambulatory Visit
Admission: RE | Admit: 2017-02-10 | Discharge: 2017-02-10 | Disposition: A | Discharge status: Home / Self Care | Payer: Medicare Other | Source: Ambulatory Visit | Attending: Endocrinology | Admitting: Endocrinology

## 2017-02-10 DIAGNOSIS — E042 Nontoxic multinodular goiter: Secondary | ICD-10-CM

## 2017-02-17 ENCOUNTER — Telehealth: Payer: Self-pay | Admitting: Endocrinology

## 2017-02-17 NOTE — Telephone Encounter (Signed)
Patient notified of US results

## 2017-02-17 NOTE — Telephone Encounter (Signed)
Patient returned call. Patient did not understand what was said on message re: ultrasound results. Please  Call patient at ph# [phone] if not available on this ph# call ph# [phone]

## 2017-03-26 ENCOUNTER — Other Ambulatory Visit: Payer: Self-pay

## 2017-03-26 ENCOUNTER — Telehealth: Payer: Self-pay | Admitting: Endocrinology

## 2017-03-26 MED ORDER — METHIMAZOLE 5 MG PO TABS: 5.0000 mg | ORAL_TABLET | Freq: Every day | ORAL | 1 refills | Status: DC

## 2017-03-26 NOTE — Telephone Encounter (Signed)
Do she still need to take this medication, please advise

## 2017-03-26 NOTE — Telephone Encounter (Signed)
Yes.   F/u ov is due

## 2017-03-26 NOTE — Telephone Encounter (Signed)
I left patient a VM stating that I sent in prescription for methimazole but she needed f/u for further refills. I asked to call back to make appointment.

## 2017-03-26 NOTE — Telephone Encounter (Signed)
Does patient need to remain on methimazole, please advise?

## 2017-03-30 ENCOUNTER — Ambulatory Visit: Payer: Medicare Other | Admitting: Endocrinology

## 2017-05-21 ENCOUNTER — Telehealth: Payer: Self-pay | Admitting: Endocrinology

## 2017-05-21 NOTE — Telephone Encounter (Signed)
As far as I know, you still need the medication. Ov is due

## 2017-05-21 NOTE — Telephone Encounter (Signed)
methimazole (TAPAZOLE) 5 MG tablet   Patient states her lab results came back negative and would like to know if she should continue this medication or not  Please advise

## 2017-05-24 NOTE — Telephone Encounter (Signed)
I called and made patient follow up appt. & advised her to still take medication.

## 2017-06-07 ENCOUNTER — Encounter: Payer: Self-pay | Admitting: Endocrinology

## 2017-06-07 ENCOUNTER — Ambulatory Visit: Payer: Medicare Other | Admitting: Endocrinology

## 2017-06-07 VITALS — BP 130/80 | HR 70 | Wt 181.8 lb

## 2017-06-07 DIAGNOSIS — E0590 Thyrotoxicosis, unspecified without thyrotoxic crisis or storm: Secondary | ICD-10-CM | POA: Diagnosis not present

## 2017-06-07 LAB — TSH: TSH: 0.51 u[IU]/mL (ref 0.35–4.50)

## 2017-06-07 LAB — T4, FREE: Free T4: 0.71 ng/dL (ref 0.60–1.60)

## 2017-06-07 NOTE — Progress Notes (Signed)
Subjective:    Patient ID: Rachel Trevino, female    DOB: [birth year], 67 y.o.   MRN: [id number]  HPI Pt returns for f/u of hyperthyroidism (multinodular goiter was dx'ed in 2012, and hyperthyroidism in 2018; US showed multinodular goiter; she chose tapzole rx).  Since on the tapazole, pt states she feels well in general.   Past Medical History:  Diagnosis Date  . Arthritis    oa  . 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 Complication of anesthesia 2012   needed fiber optic,  scope for good vocal cord view, see letter from [facility]  on chart  . Cough with expectoration  since 03-20-14   white to yeloow mucous  . Lymphedema 2012   left leg    Past Surgical History:  Procedure Laterality Date  . APPENDECTOMY    . left leg surgery for sarcoma  2012  . TONSILLECTOMY    . TOTAL HIP ARTHROPLASTY Right 03/29/2015   Procedure: RIGHT TOTAL HIP ARTHROPLASTY ANTERIOR APPROACH;  Surgeon: Mcarthur Rossetti, MD;  Location: [facility];  Service: Orthopedics;  Laterality: Right;  . TUBAL LIGATION      Social History   Socioeconomic History  . Marital status: Married    Spouse name: Not on file  . Number of children: Not on file  . Years of education: Not on file  . Highest education level: Not on file  Occupational History  . Not on file  Social Needs  . Financial resource strain: Not on file  . Food insecurity:    Worry: Not on file    Inability: Not on file  . Transportation needs:    Medical: Not on file    Non-medical: Not on file  Tobacco Use  . Smoking status: Never Smoker  . Smokeless tobacco: Never Used  Substance and Sexual Activity  . Alcohol use: No  . Drug use: No  . Sexual activity: Not on file  Lifestyle  . Physical activity:    Days per week: Not on file    Minutes per session: Not on file  . Stress: Not on file  Relationships  . Social connections:    Talks on phone: Not on file    Gets together: Not on file    Attends religious  service: Not on file    Active member of club or organization: Not on file    Attends meetings of clubs or organizations: Not on file    Relationship status: Not on file  . Intimate partner violence:    Fear of current or ex partner: Not on file    Emotionally abused: Not on file    Physically abused: Not on file    Forced sexual activity: Not on file  Other Topics Concern  . Not on file  Social History Narrative  . Not on file    Current Outpatient Medications on File Prior to Visit  Medication Sig Dispense Refill  . CALCIUM-MAGNESIUM-ZINC PO Take 1 tablet by mouth daily.    . Ginkgo Biloba 40 MG TABS Take 40 mg by mouth.    . Glucosamine HCl-MSM (GLUCOSAMINE-MSM PO) Take 1 tablet by mouth daily.    . methimazole (TAPAZOLE) 5 MG tablet Take 1 tablet (5 mg total) by mouth daily. 30 tablet 1  . Multiple Vitamin (MULTIVITAMIN) LIQD Take 5 mLs by mouth daily.    . Omega-3 Fatty Acids (FISH OIL) 1000 MG CAPS Take 1,000 mg by mouth  daily.    . TURMERIC PO Take by mouth. Once very 3 days    . acetaminophen (TYLENOL) 650 MG CR tablet Take 650 mg by mouth every 8 (eight) hours as needed for pain.    Marland Kitchen aspirin EC 325 MG EC tablet Take 1 tablet (325 mg total) by mouth 2 (two) times daily after a meal. (Patient not taking: Reported on 06/07/2017) 60 tablet 0  . guaiFENesin (MUCINEX) 600 MG 12 hr tablet Take by mouth 2 (two) times daily.    . methocarbamol (ROBAXIN) 500 MG tablet Take 1 tablet (500 mg total) by mouth every 6 (six) hours as needed for muscle spasms. (Patient not taking: Reported on 01/27/2017) 60 tablet 0  . naproxen (NAPROSYN) 375 MG tablet Take 375 mg by mouth 2 (two) times daily as needed (pain).   0  . OVER THE COUNTER MEDICATION Apply 1 spray topically 4 (four) times daily as needed (pain). cryoderm spray    . oxyCODONE (OXY IR/ROXICODONE) 5 MG immediate release tablet Take 1-2 tablets (5-10 mg total) by mouth every 4 (four) hours as needed for severe pain or breakthrough pain.  (Patient not taking: Reported on 06/07/2017) 60 tablet 0  . TRAVATAN Z 0.004 % SOLN ophthalmic solution INSTILL 1 DROP INTO EACH  EYE ONCE DAILY IN THE EVENING  12  . VITAMIN D, CHOLECALCIFEROL, PO Take 1 tablet by mouth daily.     No current facility-administered medications on file prior to visit.     No Known Allergies  Family History  Problem Relation Age of Onset  . Thyroid disease Neg Hx     BP 130/80 (BP Location: Left Arm, Patient Position: Sitting, Cuff Size: Normal)   Pulse 70   Wt 181 lb 12.8 oz (82.5 kg)   SpO2 97%   BMI 31.21 kg/m    Review of Systems Denies fever    Objective:   Physical Exam VS: see vs page GEN: no distress.   NECK: 2 cm right thyroid nodule is again palpated      Assessment & Plan:  Hyperthyroidism: due for recheck.  Patient Instructions  Thyroid blood tests are requested for you today.  We'll let you know about the results. If ever you have fever while taking methimazole, stop it and call us, even if the reason is obvious, because of the risk of a rare side-effect.   Please come back for a follow-up appointment in 3 months.  If you change your mind, we can do the radioactive iodine pill in the future.

## 2017-06-07 NOTE — Patient Instructions (Addendum)
Thyroid blood tests are requested for you today.  We'll let you know about the results. If ever you have fever while taking methimazole, stop it and call us, even if the reason is obvious, because of the risk of a rare side-effect.   Please come back for a follow-up appointment in 3 months.  If you change your mind, we can do the radioactive iodine pill in the future.

## 2017-08-15 ENCOUNTER — Other Ambulatory Visit: Payer: Self-pay | Admitting: Endocrinology

## 2017-09-07 ENCOUNTER — Ambulatory Visit: Payer: Medicare Other | Admitting: Endocrinology

## 2017-09-13 ENCOUNTER — Ambulatory Visit: Payer: Medicare Other | Admitting: Endocrinology

## 2017-09-13 DIAGNOSIS — Z0289 Encounter for other administrative examinations: Secondary | ICD-10-CM

## 2017-11-09 ENCOUNTER — Other Ambulatory Visit: Payer: Self-pay | Admitting: Obstetrics and Gynecology

## 2017-11-09 ENCOUNTER — Other Ambulatory Visit (HOSPITAL_COMMUNITY)
Admission: RE | Admit: 2017-11-09 | Discharge: 2017-11-09 | Disposition: A | Discharge status: Home / Self Care | Payer: Medicare Other | Source: Ambulatory Visit | Attending: Obstetrics and Gynecology | Admitting: Obstetrics and Gynecology

## 2017-11-09 ENCOUNTER — Other Ambulatory Visit (HOSPITAL_COMMUNITY)
Admission: RE | Admit: 2017-11-09 | Payer: Medicare Other | Source: Ambulatory Visit | Admitting: Obstetrics and Gynecology

## 2017-11-09 DIAGNOSIS — Z01411 Encounter for gynecological examination (general) (routine) with abnormal findings: Secondary | ICD-10-CM | POA: Insufficient documentation

## 2017-11-12 LAB — CYTOLOGY - PAP
Diagnosis: HIGH — AB
HPV 16/18/45 genotyping: NEGATIVE
HPV: DETECTED — AB

## 2017-11-19 ENCOUNTER — Encounter: Payer: Self-pay | Admitting: Endocrinology

## 2017-12-08 ENCOUNTER — Other Ambulatory Visit: Payer: Self-pay | Admitting: Obstetrics and Gynecology

## 2017-12-28 ENCOUNTER — Telehealth: Payer: Self-pay | Admitting: *Deleted

## 2017-12-28 NOTE — Telephone Encounter (Signed)
Called and spoke with Myrene at [facility], gave the information for the new appt. She will contact the patient.

## 2017-12-29 ENCOUNTER — Telehealth: Payer: Self-pay

## 2017-12-29 NOTE — Telephone Encounter (Signed)
Pt's p[facility]- [facility] rescheduled pt for 10-23 as pt would like to reschedule tomorrow's appt. No other needs per them at this time.

## 2017-12-30 ENCOUNTER — Ambulatory Visit: Payer: Medicare Other | Admitting: Obstetrics

## 2017-12-30 NOTE — H&P (View-Only) (Signed)
Electra at [facility] Note: New Patient FIRST VISIT   Consult was requested by Dr. 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    Chief Complaint  Patient presents with  . Carcinoma in situ of cervix, unspecified location    GYN Oncologic Summary 1. TBD o .  HPI: [name]  is a very nice 67 y.o.  P3  Per the patient's report, she had thought she could stop her Pap smear screenings but was told by a provider to continue these. She started following Dr. Simona Huh ~2018 and was found to have an abnormal pap that was cleared with prior workup. This year however, her Pap again showed an abnormality and the biopsies were showing results that needed further management. She had a lesion at 9:00 on the cervix with biopsy showing CIN3 and by Lugol's some extension to the vaginal fornix.  Thus, given the more complicated surgical management required she was referred to [place].  Imported EPIC Oncologic History:   No history exists.    Measurement of disease: .   Radiology: No results found. .  . No relevant imaging in EPIC  Outpatient Encounter Medications as of 01/05/2018  Medication Sig  . acetaminophen (TYLENOL) 650 MG CR tablet Take 650 mg by mouth every 8 (eight) hours as needed for pain.  Marland Kitchen aspirin EC 81 MG tablet Take 81 mg by mouth daily.  Marland Kitchen CALCIUM-MAGNESIUM-ZINC PO Take 1 tablet by mouth daily.  . diclofenac sodium (VOLTAREN) 1 % GEL as needed. Takes as needed for pain in the right leg.  . Ginkgo Biloba 40 MG TABS Take 40 mg by mouth daily.   . Glucosamine HCl-MSM (GLUCOSAMINE-MSM PO) Take 1 tablet by mouth daily.  . methocarbamol (ROBAXIN) 500 MG tablet Take 1 tablet (500 mg total) by mouth every 6 (six) hours as needed for muscle spasms.  . Multiple Vitamin (MULTIVITAMIN) tablet Take 1 tablet by mouth daily.  . Omega-3 Fatty Acids (FISH  OIL) 1000 MG CAPS Take 1,000 mg by mouth once a week.   . TRAVATAN Z 0.004 % SOLN ophthalmic solution INSTILL 1 DROP INTO EACH  EYE ONCE DAILY IN THE EVENING  . TURMERIC PO Take by mouth. Once every  2 to 3 days  . VITAMIN D, CHOLECALCIFEROL, PO Take 1 tablet by mouth daily.  . [DISCONTINUED] aspirin EC 325 MG EC tablet Take 1 tablet (325 mg total) by mouth 2 (two) times daily after a meal. (Patient not taking: Reported on 06/07/2017)  . [DISCONTINUED] guaiFENesin (MUCINEX) 600 MG 12 hr tablet Take by mouth 2 (two) times daily.  . [DISCONTINUED] methimazole (TAPAZOLE) 5 MG tablet Take 1 tablet (5 mg total) by mouth daily. (Patient not taking: Reported on 01/05/2018)  . [DISCONTINUED] methimazole (TAPAZOLE) 5 MG tablet TAKE 1 TABLET (5 MG TOTAL) DAILY BY MOUTH. (Patient not taking: Reported on 01/05/2018)  . [DISCONTINUED] Multiple Vitamin (MULTIVITAMIN) LIQD Take 5 mLs by mouth daily.  . [DISCONTINUED] naproxen (NAPROSYN) 375 MG tablet Take 375 mg by mouth 2 (two) times daily as needed (pain).   . [DISCONTINUED] OVER THE COUNTER MEDICATION Apply 1 spray topically 4 (four) times daily as needed (pain). cryoderm spray  . [DISCONTINUED] oxyCODONE (OXY IR/ROXICODONE) 5 MG immediate release tablet Take 1-2 tablets (5-10 mg total) by mouth every 4 (four) hours as needed for severe pain or breakthrough pain. (Patient not  taking: Reported on 06/07/2017)   No facility-administered encounter medications on file as of 01/05/2018.    No Known Allergies  Past Medical History:  Diagnosis Date  . Arthritis    oa  . Complication of anesthesia 2012   needed fiber optic,  scope for good vocal cord view, see letter from [facility]  on chart  . Lymphedema 2012   left leg  . Sarcoma of lower extremity, left ([place]) 2012   s/p surgery and radiation   Past Surgical History:  Procedure Laterality Date  . APPENDECTOMY     open. denies long hospitilization with procedure  . left leg surgery for sarcoma  2012   and  radiation  . TONSILLECTOMY    . TOTAL HIP ARTHROPLASTY Right 03/29/2015   Procedure: RIGHT TOTAL HIP ARTHROPLASTY ANTERIOR APPROACH;  Surgeon: Mcarthur Rossetti, MD;  Location: [facility];  Service: Orthopedics;  Laterality: Right;  . TUBAL LIGATION          Past Gynecological History:   GYNECOLOGIC HISTORY:  . No LMP recorded. Patient is postmenopausal. 67yo  . Menarche: 67 years old . P 3 . Contraceptive prior use . HRT none  . Last Pap reason for referral Family Hx:  Family History  Problem Relation Age of Onset  . Alzheimer's disease Mother   . Alzheimer's disease Father   . Alzheimer's disease Sister   . Breast cancer Sister   . Parkinson's disease Brother   . Depression Sister   . Thyroid disease Neg Hx    Social Hx:  Marland Kitchen Tobacco use: none . Alcohol use: 1/week . Illicit Drug use: none . Illicit IV Drug use: none    Review of Systems: Review of Systems  Cardiovascular: Positive for leg swelling.  Genitourinary: Positive for vaginal discharge.   Musculoskeletal: Positive for arthralgias.  Psychiatric/Behavioral: The patient is nervous/anxious.   All other systems reviewed and are negative.   Vitals:  Vitals:   01/05/18 0854  BP: (!) 153/83  Pulse: 71  Resp: 20  Temp: 98.4 F (36.9 C)  SpO2: 100%   Vitals:   01/05/18 0854  Weight: 175 lb 14.4 oz (79.8 kg)  Height: 5\' 4"  (1.626 m)   Body mass index is 30.19 kg/m.  Physical Exam: General :  Well developed, 67 y.o., female in no apparent distress HEENT:  Normocephalic/atraumatic, symmetric, EOMI, eyelids normal Neck:   Supple, no masses.  Lymphatics:  No cervical/ submandibular/ supraclavicular/ infraclavicular/ inguinal adenopathy Respiratory:  Respirations unlabored, no use of accessory muscles CV:   Deferred Breast:  Deferred Musculoskeletal: No CVA tenderness, normal muscle strength. Abdomen:  Soft, non-tender and nondistended. No evidence of hernia. No masses. Extremities:  No lymphedema, no  erythema, non-tender. Skin:   Normal inspection Neuro/Psych:  No focal motor deficit, no abnormal mental status. Normal gait. Normal affect. Alert and oriented to person, place, and time  Genito Urinary: Vulva: Normal external female genitalia.  Bladder/urethra: Urethral meatus normal in size and location. No lesions or   masses, well supported bladder Speculum exam: Vagina: Application of Lugol's shows decreased uptake on right lateral ectocervix extending to right vaginal fornix 9:00. Cervix: otherwise normal appearing, no lesions. Bimanual exam:  Uterus: Normal size, mobile.  Adnexa: No masses. Rectovaginal:  Good tone, no masses, no cul de sac nodularity, no parametrial involvement or nodularity.   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 Complexity of visit ? This is a new problem and additional workup  is planned ? Data reviewed ? There are no radiology reports to review ? We did review the Pap results and concept of HPV infection and persistence.  ? I told her there is no way to know how long ago she was exposed to the [place] ? I reviewed her referring doctor's office notes and I have summarized in the HPI ? History was obtained from the patient and the chart ? Dr. Simona Huh and I have discussed her reason for referral and the findings on her exam, which were similar to mine today ? This is an undiagnosed new problem with uncertain prognosis and decision is to have a minor surgery with identified risk factors of location of process and risk to surrounding structures. 2. Management ? We discussed cervical conization indicated to prevent progression and to rule out invasive CA ? The alteral portion of with decreased Lugol's can be treated with laser at the time of the cone. ? I will request an Omni laser 3. HPV ? We reviewed the above and that we are not able to rid her of the virus. ? She will require long term followup and  understands there is a high risk of recurrence of disease 4. Hysterectomy was discussed and would require upper vaginectomy o She would not prevent vaginal dysplasia down the road and would not be able to avoid continued Pap smears and workup for abnormalities o I recommended we proceed with this step and then if recurrence offer repeat surgical management (laser +/- excision) then 5FU PV and if both of those additional treatments fail then consider the more aggressive approach of hysterectomy/upper vaginectomy.   Rachel Piggs, MD Gynecologic Oncologist 01/05/2018, 10:04 AM    Cc: Thurnell Lose, MD (Referring Ob/Gyn) Rachel Jordan, MD (PCP)

## 2017-12-30 NOTE — Progress Notes (Addendum)
Consult Note: New Patient FIRST VISIT   Consult was requested by Dr. 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    Chief Complaint  Patient presents with  . Carcinoma in situ of cervix, unspecified location    GYN Oncologic Summary 1. TBD o .  HPI: [name]  is a very nice 67 y.o.  P3  Per the patient's report, she had thought she could stop her Pap smear screenings but was told by a provider to continue these. She started following Dr. Simona Trevino ~2018 and was found to have an abnormal pap that was cleared with prior workup. This year however, her Pap again showed an abnormality and the biopsies were showing results that needed further management. She had a lesion at 9:00 on the cervix with biopsy showing CIN3 and by Lugol's some extension to the vaginal fornix.  Thus, given the more complicated surgical management required she was referred to [place].  Imported EPIC Oncologic History:   No history exists.    Measurement of disease: .   Radiology: No results found. .  . No relevant imaging in EPIC  Outpatient Encounter Medications as of 01/05/2018  Medication Sig  . acetaminophen (TYLENOL) 650 MG CR tablet Take 650 mg by mouth every 8 (eight) hours as needed for pain.  Marland Kitchen aspirin EC 81 MG tablet Take 81 mg by mouth daily.  Marland Kitchen CALCIUM-MAGNESIUM-ZINC PO Take 1 tablet by mouth daily.  . diclofenac sodium (VOLTAREN) 1 % GEL as needed. Takes as needed for pain in the right leg.  . Ginkgo Biloba 40 MG TABS Take 40 mg by mouth daily.   . Glucosamine HCl-MSM (GLUCOSAMINE-MSM PO) Take 1 tablet by mouth daily.  . methocarbamol (ROBAXIN) 500 MG tablet Take 1 tablet (500 mg total) by mouth every 6 (six) hours as needed for muscle spasms.  . Multiple Vitamin (MULTIVITAMIN) tablet Take 1 tablet by mouth daily.  . Omega-3 Fatty Acids (FISH OIL) 1000 MG CAPS Take 1,000 mg by mouth once a week.   . TRAVATAN  Z 0.004 % SOLN ophthalmic solution INSTILL 1 DROP INTO EACH  EYE ONCE DAILY IN THE EVENING  . TURMERIC PO Take by mouth. Once every  2 to 3 days  . VITAMIN D, CHOLECALCIFEROL, PO Take 1 tablet by mouth daily.  . [DISCONTINUED] aspirin EC 325 MG EC tablet Take 1 tablet (325 mg total) by mouth 2 (two) times daily after a meal. (Patient not taking: Reported on 06/07/2017)  . [DISCONTINUED] guaiFENesin (MUCINEX) 600 MG 12 hr tablet Take by mouth 2 (two) times daily.  . [DISCONTINUED] methimazole (TAPAZOLE) 5 MG tablet Take 1 tablet (5 mg total) by mouth daily. (Patient not taking: Reported on 01/05/2018)  . [DISCONTINUED] methimazole (TAPAZOLE) 5 MG tablet TAKE 1 TABLET (5 MG TOTAL) DAILY BY MOUTH. (Patient not taking: Reported on 01/05/2018)  . [DISCONTINUED] Multiple Vitamin (MULTIVITAMIN) LIQD Take 5 mLs by mouth daily.  . [DISCONTINUED] naproxen (NAPROSYN) 375 MG tablet Take 375 mg by mouth 2 (two) times daily as needed (pain).   . [DISCONTINUED] OVER THE COUNTER MEDICATION Apply 1 spray topically 4 (four) times daily as needed (pain). cryoderm spray  . [DISCONTINUED] oxyCODONE (OXY IR/ROXICODONE) 5 MG immediate release tablet Take 1-2 tablets (5-10 mg total) by mouth every 4 (four) hours as needed for severe pain or breakthrough pain. (Patient not taking: Reported on 06/07/2017)   No facility-administered encounter medications on  file as of 01/05/2018.    No Known Allergies  Past Medical History:  Diagnosis Date  . Arthritis    oa  . Complication of anesthesia 2012   needed fiber optic,  scope for good vocal cord view, see letter from [facility]  on chart  . Lymphedema 2012   left leg  . Sarcoma of lower extremity, left ([employer]) 2012   s/p surgery and radiation   Past Surgical History:  Procedure Laterality Date  . APPENDECTOMY     open. denies long hospitilization with procedure  . left leg surgery for sarcoma  2012   and radiation  . TONSILLECTOMY    . TOTAL HIP ARTHROPLASTY Right 03/29/2015    Procedure: RIGHT TOTAL HIP ARTHROPLASTY ANTERIOR APPROACH;  Surgeon: Mcarthur Rossetti, MD;  Location: [facility];  Service: Orthopedics;  Laterality: Right;  . TUBAL LIGATION          Past Gynecological History:   GYNECOLOGIC HISTORY:  . No LMP recorded. Patient is postmenopausal. 67yo  . Menarche: 67 years old . P 3 . Contraceptive prior use . HRT none  . Last Pap reason for referral Family Hx:  Family History  Problem Relation Age of Onset  . Alzheimer's disease Mother   . Alzheimer's disease Father   . Alzheimer's disease Sister   . Breast cancer Sister   . Parkinson's disease Brother   . Depression Sister   . Thyroid disease Neg Hx    Social Hx:  Marland Kitchen Tobacco use: none . Alcohol use: 1/week . Illicit Drug use: none . Illicit IV Drug use: none    Review of Systems: Review of Systems  Cardiovascular: Positive for leg swelling.  Genitourinary: Positive for vaginal discharge.   Musculoskeletal: Positive for arthralgias.  Psychiatric/Behavioral: The patient is nervous/anxious.   All other systems reviewed and are negative.   Vitals:  Vitals:   01/05/18 0854  BP: (!) 153/83  Pulse: 71  Resp: 20  Temp: 98.4 F (36.9 C)  SpO2: 100%   Vitals:   01/05/18 0854  Weight: 175 lb 14.4 oz (79.8 kg)  Height: 5\' 4"  (1.626 m)   Body mass index is 30.19 kg/m.  Physical Exam: General :  Well developed, 67 y.o., female in no apparent distress HEENT:  Normocephalic/atraumatic, symmetric, EOMI, eyelids normal Neck:   Supple, no masses.  Lymphatics:  No cervical/ submandibular/ supraclavicular/ infraclavicular/ inguinal adenopathy Respiratory:  Respirations unlabored, no use of accessory muscles CV:   Deferred Breast:  Deferred Musculoskeletal: No CVA tenderness, normal muscle strength. Abdomen:  Soft, non-tender and nondistended. No evidence of hernia. No masses. Extremities:  No lymphedema, no erythema, non-tender. Skin:   Normal inspection Neuro/Psych:  No focal  motor deficit, no abnormal mental status. Normal gait. Normal affect. Alert and oriented to person, place, and time  Genito Urinary: Vulva: Normal external female genitalia.  Bladder/urethra: Urethral meatus normal in size and location. No lesions or   masses, well supported bladder Speculum exam: Vagina: Application of Lugol's shows decreased uptake on right lateral ectocervix extending to right vaginal fornix 9:00. Cervix: otherwise normal appearing, no lesions. Bimanual exam:  Uterus: Normal size, mobile.  Adnexa: No masses. Rectovaginal:  Good tone, no masses, no cul de sac nodularity, no parametrial involvement or nodularity.   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 Complexity of visit ? This is a new problem and additional workup is planned ? Data reviewed ? There are no radiology reports  to review ? We did review the Pap results and concept of HPV infection and persistence.  ? I told her there is no way to know how long ago she was exposed to the [place] ? I reviewed her referring doctor's office notes and I have summarized in the HPI ? History was obtained from the patient and the chart ? Dr. Simona Trevino and I have discussed her reason for referral and the findings on her exam, which were similar to mine today ? This is an undiagnosed new problem with uncertain prognosis and decision is to have a minor surgery with identified risk factors of location of process and risk to surrounding structures. 2. Management ? We discussed cervical conization indicated to prevent progression and to rule out invasive CA ? The alteral portion of with decreased Lugol's can be treated with laser at the time of the cone. ? I will request an Omni laser 3. HPV ? We reviewed the above and that we are not able to rid her of the virus. ? She will require long term followup and understands there is a high risk of recurrence of disease 4. Hysterectomy was  discussed and would require upper vaginectomy o She would not prevent vaginal dysplasia down the road and would not be able to avoid continued Pap smears and workup for abnormalities o I recommended we proceed with this step and then if recurrence offer repeat surgical management (laser +/- excision) then 5FU PV and if both of those additional treatments fail then consider the more aggressive approach of hysterectomy/upper vaginectomy.   Mart Piggs, MD Gynecologic Oncologist 01/05/2018, 10:04 AM    Cc: Thurnell Lose, MD (Referring Ob/Gyn) Jonathon Jordan, MD (PCP)

## 2018-01-05 ENCOUNTER — Inpatient Hospital Stay: Payer: Medicare Other | Attending: Obstetrics | Admitting: Obstetrics

## 2018-01-05 ENCOUNTER — Encounter: Payer: Self-pay | Admitting: Obstetrics

## 2018-01-05 VITALS — BP 153/83 | HR 71 | Temp 98.4°F | Resp 20 | Ht 64.0 in | Wt 175.9 lb

## 2018-01-05 DIAGNOSIS — N893 Dysplasia of vagina, unspecified: Secondary | ICD-10-CM | POA: Diagnosis not present

## 2018-01-05 DIAGNOSIS — D069 Carcinoma in situ of cervix, unspecified: Secondary | ICD-10-CM | POA: Diagnosis present

## 2018-01-05 NOTE — Patient Instructions (Signed)
Plan to have a conization of the cervix and vaginal laser at the [facility] on January 13, 2018.  You will receive a phone call from the pre-surgical RN to discuss instructions.  Please call for any questions or concerns.   Cervical Conization  Cervical conization (cone biopsy) is a procedure in which a cone-shaped portion of the cervix is cut out so that it can be examined under a microscope. The procedure is done to check for cancer cells or cells that might turn into cancer (precancerous cells). You may have this procedure if:  You have abnormal bleeding from your cervix.  You had an abnormal Pap test.  Something abnormal was seen on your cervix during an exam.  This procedure is performed in either a health care provider's office or in an operating room. Tell a health care provider about:  Any allergies you have.  All medicines you are taking, including vitamins, herbs, eye drops, creams, and over-the-counter medicines.  Any problems you or family members have had with the use of anesthetic medicines.  Any blood disorders you have.  Any surgeries you have had.  Any medical conditions you have.  Your smoking habits.  When you normally have your period.  Whether you are pregnant or may be pregnant. What are the risks? Generally, this is a safe procedure. However, problems may occur, including:  Heavy bleeding for several days or weeks after the procedure.  Allergic reactions to medicines or dyes.  Increased risk of preterm labor in future pregnancies.  Infection (rare).  Damage to the cervix or other structures or organs (rare).  What happens before the procedure? Staying hydrated  Eating and drinking restrictions Follow instructions from your health care provider about eating and drinking  General instructions  Do not douche, have sex, use tampons, or use any vaginal medicines before the procedure as told by your health care  provider.  You may be asked to empty your bladder and bowel right before the procedure.  Ask your health care provider about: ? Changing or stopping your normal medicines. This is important if you take diabetes medicines or blood thinners. ? Taking medicines such as aspirin and ibuprofen. These medicines can thin your blood. Do not take these medicines before your procedure if your doctor tells you not to.  Plan to have someone take you home from the hospital or clinic. What happens during the procedure?  To reduce your risk of infection: ? Your health care team will wash or sanitize their hands. ? Your skin will be washed with soap. ? Hair may be removed from the surgical area.  You will undress from the waist down and be given a gown to wear.  You will lie on an examining table and put your feet in stirrups.  An IV tube will be inserted into one of your veins.  You will be given one or more of the following: ? A medicine to help you relax (sedative). ? A medicine to numb the area (local anesthetic). ? A medicine to make you fall asleep (general anesthetic). ? A medicine that numbs the cervix (cervical block).  A lubricated device called a speculum will be inserted into your vagina. It will be used to spread open the walls of the vagina so your health care provider can see the inside of the vagina and cervix better.  An instrument that has a magnifying lens and a light (colposcope) will let your health care provider examine the cervix  more closely.  Your health care provider will apply a solution to your cervix. This turns abnormal areas a pale color.  A tissue sample will be removed from the cervix using one of the following methods: ? The cold knife method. In this method, the tissue is cut out with a knife (scalpel). ? The loop electrosurgical excision procedure (LEEP) method. In this method, the tissue is cut out with a thin wire that can burn (cauterize) the tissue with an  electrical current. ? Laser treatment method. In this method, the tissue is cut out and then cauterized with a laser beam to prevent bleeding.  Your health care provider will apply a paste over the biopsy areas to help control bleeding.  The tissue sample will be examined under a microscope. The procedure may vary among health care providers and hospitals. What happens after the procedure?  Your blood pressure, heart rate, breathing rate, and blood oxygen level will be monitored often until the medicines you were given have worn off.  If you were given a local anesthetic, you will rest at the clinic or hospital until you are stable and feel ready to go home.  If you were given a general anesthetic, you may be monitored for a longer period of time.  You may have some cramping.  You may have bloody discharge or light to moderate bleeding.  You may have dark discharge coming from your vagina. This is from the paste used on the cervix to prevent bleeding. Summary  Cervical conization is a procedure in which a cone-shaped portion of the cervix is cut out so that it can be examined under a microscope.  The procedure is done to check for cancer cells or cells that might turn into cancer (precancerous cells). This information is not intended to replace advice given to you by your health care provider. Make sure you discuss any questions you have with your health care provider. Document Released: 12/10/2004 Document Revised: 03/04/2016 Document Reviewed: 03/04/2016 Elsevier Interactive Patient Education  2017 [employer].

## 2018-01-07 ENCOUNTER — Telehealth: Payer: Self-pay | Admitting: Oncology

## 2018-01-07 NOTE — Telephone Encounter (Signed)
Called [facility] scheduler at [phone] to set up the [place] for surgery on 01/13/18.

## 2018-01-10 ENCOUNTER — Encounter (HOSPITAL_BASED_OUTPATIENT_CLINIC_OR_DEPARTMENT_OTHER): Payer: Self-pay | Admitting: *Deleted

## 2018-01-10 ENCOUNTER — Other Ambulatory Visit: Payer: Self-pay

## 2018-01-10 NOTE — Progress Notes (Signed)
Spoke with Rachel Trevino  npo after midnight arrive 745 am 01-13-18 [facility]  meds to take sip of water: tylenol prn Will arrange friend for driver No labs needed Surgery orders in epic

## 2018-01-13 ENCOUNTER — Encounter (HOSPITAL_BASED_OUTPATIENT_CLINIC_OR_DEPARTMENT_OTHER): Payer: Self-pay

## 2018-01-13 ENCOUNTER — Other Ambulatory Visit: Payer: Self-pay

## 2018-01-13 ENCOUNTER — Ambulatory Visit (HOSPITAL_BASED_OUTPATIENT_CLINIC_OR_DEPARTMENT_OTHER)
Admission: RE | Admit: 2018-01-13 | Discharge: 2018-01-13 | Disposition: A | Discharge status: Home / Self Care | Payer: Medicare Other | Source: Ambulatory Visit | Attending: Obstetrics | Admitting: Obstetrics

## 2018-01-13 ENCOUNTER — Ambulatory Visit (HOSPITAL_BASED_OUTPATIENT_CLINIC_OR_DEPARTMENT_OTHER): Payer: Medicare Other | Admitting: Anesthesiology

## 2018-01-13 ENCOUNTER — Encounter (HOSPITAL_BASED_OUTPATIENT_CLINIC_OR_DEPARTMENT_OTHER)
Admission: RE | Disposition: A | Discharge status: Home / Self Care | Payer: Self-pay | Source: Ambulatory Visit | Attending: Obstetrics

## 2018-01-13 DIAGNOSIS — Z8589 Personal history of malignant neoplasm of other organs and systems: Secondary | ICD-10-CM | POA: Diagnosis not present

## 2018-01-13 DIAGNOSIS — Z79899 Other long term (current) drug therapy: Secondary | ICD-10-CM | POA: Insufficient documentation

## 2018-01-13 DIAGNOSIS — Z923 Personal history of irradiation: Secondary | ICD-10-CM | POA: Insufficient documentation

## 2018-01-13 DIAGNOSIS — M1990 Unspecified osteoarthritis, unspecified site: Secondary | ICD-10-CM | POA: Insufficient documentation

## 2018-01-13 DIAGNOSIS — D069 Carcinoma in situ of cervix, unspecified: Secondary | ICD-10-CM | POA: Diagnosis not present

## 2018-01-13 DIAGNOSIS — Z7982 Long term (current) use of aspirin: Secondary | ICD-10-CM | POA: Diagnosis not present

## 2018-01-13 DIAGNOSIS — N893 Dysplasia of vagina, unspecified: Secondary | ICD-10-CM | POA: Diagnosis present

## 2018-01-13 DIAGNOSIS — N841 Polyp of cervix uteri: Secondary | ICD-10-CM | POA: Diagnosis not present

## 2018-01-13 HISTORY — PX: CERVICAL CONIZATION W/BX: SHX1330

## 2018-01-13 HISTORY — PX: LASER ABLATION CONDOLAMATA: SHX5941

## 2018-01-13 SURGERY — CONE BIOPSY, CERVIX
Anesthesia: General | Site: Vagina

## 2018-01-13 MED ORDER — LACTATED RINGERS IV SOLN
INTRAVENOUS | Status: DC
  Administered 2018-01-13: 11:00:00 via INTRAVENOUS
  Administered 2018-01-13: 1000 mL via INTRAVENOUS
  Filled 2018-01-13: qty 1000

## 2018-01-13 MED ORDER — FENTANYL CITRATE (PF) 100 MCG/2ML IJ SOLN
INTRAMUSCULAR | Status: DC | PRN
  Administered 2018-01-13 (×2): 25 ug via INTRAVENOUS
  Administered 2018-01-13: 50 ug via INTRAVENOUS

## 2018-01-13 MED ORDER — FENTANYL CITRATE (PF) 100 MCG/2ML IJ SOLN
INTRAMUSCULAR | Status: AC
  Filled 2018-01-13: qty 2

## 2018-01-13 MED ORDER — PHENYLEPHRINE 40 MCG/ML (10ML) SYRINGE FOR IV PUSH (FOR BLOOD PRESSURE SUPPORT)
PREFILLED_SYRINGE | INTRAVENOUS | Status: AC
  Filled 2018-01-13: qty 20

## 2018-01-13 MED ORDER — PROMETHAZINE HCL 25 MG/ML IJ SOLN
6.2500 mg | INTRAMUSCULAR | Status: DC | PRN
  Filled 2018-01-13: qty 1

## 2018-01-13 MED ORDER — ACETIC ACID 5 % SOLN
Status: DC | PRN
  Administered 2018-01-13: 1 via TOPICAL

## 2018-01-13 MED ORDER — MIDAZOLAM HCL 2 MG/2ML IJ SOLN
0.5000 mg | Freq: Once | INTRAMUSCULAR | Status: DC | PRN
  Filled 2018-01-13: qty 2

## 2018-01-13 MED ORDER — PHENYLEPHRINE HCL 10 MG/ML IJ SOLN
INTRAMUSCULAR | Status: DC | PRN
  Administered 2018-01-13 (×6): 80 ug via INTRAVENOUS

## 2018-01-13 MED ORDER — SILVER NITRATE-POT NITRATE 75-25 % EX MISC
CUTANEOUS | Status: AC
  Filled 2018-01-13: qty 1

## 2018-01-13 MED ORDER — HYDROCODONE-ACETAMINOPHEN 5-325 MG PO TABS: 1.0000 | ORAL_TABLET | Freq: Four times a day (QID) | ORAL | 0 refills | Status: DC | PRN

## 2018-01-13 MED ORDER — DEXAMETHASONE SODIUM PHOSPHATE 10 MG/ML IJ SOLN
INTRAMUSCULAR | Status: AC
  Filled 2018-01-13: qty 1

## 2018-01-13 MED ORDER — OXYCODONE HCL 5 MG PO TABS
5.0000 mg | ORAL_TABLET | Freq: Once | ORAL | Status: DC
  Filled 2018-01-13: qty 1

## 2018-01-13 MED ORDER — PROPOFOL 10 MG/ML IV BOLUS
INTRAVENOUS | Status: DC | PRN
  Administered 2018-01-13: 200 mg via INTRAVENOUS

## 2018-01-13 MED ORDER — MEPERIDINE HCL 25 MG/ML IJ SOLN
6.2500 mg | INTRAMUSCULAR | Status: DC | PRN
  Filled 2018-01-13: qty 1

## 2018-01-13 MED ORDER — SILVER SULFADIAZINE 1 % EX CREA
TOPICAL_CREAM | CUTANEOUS | Status: AC
  Filled 2018-01-13: qty 50

## 2018-01-13 MED ORDER — DEXAMETHASONE SODIUM PHOSPHATE 10 MG/ML IJ SOLN
INTRAMUSCULAR | Status: DC | PRN
  Administered 2018-01-13: 4 mg via INTRAVENOUS

## 2018-01-13 MED ORDER — FERRIC SUBSULFATE SOLN
Status: DC | PRN
  Administered 2018-01-13: 1

## 2018-01-13 MED ORDER — IODINE STRONG (LUGOLS) 5 % PO SOLN
ORAL | Status: AC
  Filled 2018-01-13: qty 1

## 2018-01-13 MED ORDER — FENTANYL CITRATE (PF) 100 MCG/2ML IJ SOLN
25.0000 ug | INTRAMUSCULAR | Status: DC | PRN
  Administered 2018-01-13: 25 ug via INTRAVENOUS
  Filled 2018-01-13: qty 1

## 2018-01-13 MED ORDER — ONDANSETRON HCL 4 MG/2ML IJ SOLN
INTRAMUSCULAR | Status: DC | PRN
  Administered 2018-01-13: 4 mg via INTRAVENOUS

## 2018-01-13 MED ORDER — GLYCOPYRROLATE 0.2 MG/ML IJ SOLN
INTRAMUSCULAR | Status: DC | PRN
  Administered 2018-01-13: 0.2 mg via INTRAVENOUS

## 2018-01-13 MED ORDER — ACETIC ACID 5 % SOLN
Status: AC
  Filled 2018-01-13: qty 500

## 2018-01-13 MED ORDER — KETOROLAC TROMETHAMINE 30 MG/ML IJ SOLN
INTRAMUSCULAR | Status: DC | PRN
  Administered 2018-01-13: 30 mg via INTRAVENOUS

## 2018-01-13 MED ORDER — ONDANSETRON HCL 4 MG/2ML IJ SOLN
INTRAMUSCULAR | Status: AC
  Filled 2018-01-13: qty 2

## 2018-01-13 MED ORDER — EPHEDRINE 5 MG/ML INJ
INTRAVENOUS | Status: AC
  Filled 2018-01-13: qty 10

## 2018-01-13 MED ORDER — CEFAZOLIN SODIUM-DEXTROSE 2-4 GM/100ML-% IV SOLN
INTRAVENOUS | Status: AC
  Filled 2018-01-13: qty 100

## 2018-01-13 MED ORDER — LIDOCAINE-EPINEPHRINE 1 %-1:100000 IJ SOLN
INTRAMUSCULAR | Status: AC
  Filled 2018-01-13: qty 1

## 2018-01-13 MED ORDER — OXYCODONE HCL 5 MG PO TABS
ORAL_TABLET | ORAL | Status: AC
  Filled 2018-01-13: qty 1

## 2018-01-13 MED ORDER — IODINE STRONG (LUGOLS) 5 % PO SOLN
ORAL | Status: DC | PRN
  Administered 2018-01-13: 14 mL

## 2018-01-13 MED ORDER — EPHEDRINE SULFATE 50 MG/ML IJ SOLN
INTRAMUSCULAR | Status: DC | PRN
  Administered 2018-01-13: 10 mg via INTRAVENOUS

## 2018-01-13 MED ORDER — LIDOCAINE HCL (CARDIAC) PF 100 MG/5ML IV SOSY
PREFILLED_SYRINGE | INTRAVENOUS | Status: DC | PRN
  Administered 2018-01-13: 50 mg via INTRAVENOUS

## 2018-01-13 MED ORDER — MIDAZOLAM HCL 2 MG/2ML IJ SOLN
INTRAMUSCULAR | Status: AC
  Filled 2018-01-13: qty 2

## 2018-01-13 MED ORDER — LIDOCAINE-EPINEPHRINE 1 %-1:100000 IJ SOLN
INTRAMUSCULAR | Status: DC | PRN
  Administered 2018-01-13: 6 mL

## 2018-01-13 MED ORDER — CEFAZOLIN SODIUM-DEXTROSE 2-4 GM/100ML-% IV SOLN
2.0000 g | INTRAVENOUS | Status: AC
  Administered 2018-01-13: 2 g via INTRAVENOUS
  Filled 2018-01-13: qty 100

## 2018-01-13 MED ORDER — LIDOCAINE HCL 1 % IJ SOLN
INTRAMUSCULAR | Status: AC
  Filled 2018-01-13: qty 20

## 2018-01-13 MED ORDER — MIDAZOLAM HCL 5 MG/5ML IJ SOLN
INTRAMUSCULAR | Status: DC | PRN
  Administered 2018-01-13: 2 mg via INTRAVENOUS

## 2018-01-13 MED ORDER — LIDOCAINE 2% (20 MG/ML) 5 ML SYRINGE
INTRAMUSCULAR | Status: AC
  Filled 2018-01-13: qty 5

## 2018-01-13 SURGICAL SUPPLY — 35 items
BLADE EXTENDED COATED 6.5IN (ELECTRODE) IMPLANT
BLADE SURG 11 STRL SS (BLADE) ×8 IMPLANT
CANISTER SUCT 3000ML PPV (MISCELLANEOUS) ×4 IMPLANT
CANISTER SUCTION 1200CC (MISCELLANEOUS) IMPLANT
CATH ROBINSON RED A/P 16FR (CATHETERS) ×4 IMPLANT
COVER SURGICAL LIGHT HANDLE (MISCELLANEOUS) ×4 IMPLANT
COVER WAND RF STERILE (DRAPES) IMPLANT
DEPRESSOR TONGUE BLADE STERILE (MISCELLANEOUS) ×4 IMPLANT
DILATOR CANAL MILEX (MISCELLANEOUS) ×4 IMPLANT
ELECT BALL LEEP 3MM BLK (ELECTRODE) ×4 IMPLANT
GLOVE BIO SURGEON STRL SZ 6.5 (GLOVE) ×6 IMPLANT
GLOVE BIO SURGEONS STRL SZ 6.5 (GLOVE) ×2
GLOVE BIOGEL PI IND STRL 7.0 (GLOVE) ×4 IMPLANT
GLOVE BIOGEL PI INDICATOR 7.0 (GLOVE) ×4
GOWN STRL REUS W/TWL LRG LVL3 (GOWN DISPOSABLE) ×8 IMPLANT
HEMOSTAT SURGICEL 4X8 (HEMOSTASIS) ×8 IMPLANT
KIT TURNOVER CYSTO (KITS) ×4 IMPLANT
NEEDLE HYPO 25X1 1.5 SAFETY (NEEDLE) IMPLANT
NEEDLE SPNL 22GX3.5 QUINCKE BK (NEEDLE) ×4 IMPLANT
NS IRRIG 500ML POUR BTL (IV SOLUTION) IMPLANT
PACK VAGINAL WOMENS (CUSTOM PROCEDURE TRAY) ×4 IMPLANT
PAD PREP 24X48 CUFFED NSTRL (MISCELLANEOUS) ×4 IMPLANT
SCOPETTES 8  STERILE (MISCELLANEOUS) ×4
SCOPETTES 8 STERILE (MISCELLANEOUS) ×4 IMPLANT
SUT VIC AB 0 CT1 36 (SUTURE) ×4 IMPLANT
SUT VIC AB 3-0 SH 27 (SUTURE) ×2
SUT VIC AB 3-0 SH 27X BRD (SUTURE) ×2 IMPLANT
SUT VICRYL 0 UR6 27IN ABS (SUTURE) ×8 IMPLANT
SYR BULB IRRIGATION 50ML (SYRINGE) ×4 IMPLANT
TOWEL OR 17X24 6PK STRL BLUE (TOWEL DISPOSABLE) ×4 IMPLANT
TUBE CONNECTING 12'X1/4 (SUCTIONS) ×1
TUBE CONNECTING 12X1/4 (SUCTIONS) ×3 IMPLANT
VACUUM HOSE 7/8X10 W/ WAND (MISCELLANEOUS) IMPLANT
VACUUM HOSE/TUBING 7/8INX6FT (MISCELLANEOUS) ×4 IMPLANT
WATER STERILE IRR 500ML POUR (IV SOLUTION) ×4 IMPLANT

## 2018-01-13 NOTE — Anesthesia Procedure Notes (Signed)
Procedure Name: LMA Insertion Date/Time: 01/13/2018 10:04 AM Performed by: Jonna Munro, CRNA Pre-anesthesia Checklist: Patient identified, Emergency Drugs available, Suction available, Patient being monitored and Timeout performed Patient Re-evaluated:Patient Re-evaluated prior to induction Oxygen Delivery Method: Circle system utilized Preoxygenation: Pre-oxygenation with 100% oxygen Induction Type: IV induction LMA: LMA inserted LMA Size: 3.0 Number of attempts: 1 Placement Confirmation: positive ETCO2 and breath sounds checked- equal and bilateral Tube secured with: Tape Dental Injury: Teeth and Oropharynx as per pre-operative assessment

## 2018-01-13 NOTE — Discharge Instructions (Signed)
.sp Cervical Conization, Care After This sheet gives you information about how to care for yourself after your procedure. Your health care provider may also give you more specific instructions. If you have problems or questions, contact your health care provider. What can I expect after the procedure? After the procedure, it is common to have:  A groggy feeling, if you were given medicine to make you fall asleep (general anesthetic).  Cramps that feel similar to menstrual cramps.  Bloody discharge or light to moderate bleeding.  Dark discharge. This discharge may look similar to coffee grounds. This is from the paste that was applied to the cervix to control bleeding.  Follow these instructions at home: Medicines  Take over-the-counter and prescription medicines only as told by your health care provider.  Do not take aspirin until your health care provider says it is okay. Aspirin can cause bleeding.  If you are taking pain medicine: ? You may need to prevent or treat constipation. To do this, your health care provider may recommend that you:  Drink enough fluid to keep your urine clear or pale yellow.  Take over-the-counter or prescription medicines.  Eat foods that are high in fiber, such as fresh fruits and vegetables, whole grains, bran, and beans.  Limit foods that are high in fat and processed sugars, such as fried and sweet foods. ? Do not drive or use heavy machinery. General instructions  You may resume your normal diet unless your health care provider advises you not to do so.  Take showers for the first week. Do not take baths, swim, or use hot tubs until your health care provider says it is okay.  Do not douche, use tampons, or have sex until your health care provider says it is okay.  Avoid activities that require great effort, such as exercises and heavy lifting, for at least 7-14 days.  Keep all follow-up visits as told by your health care provider. This is  important. Contact a health care provider if:  You develop a rash.  You are dizzy or lightheaded.  You feel nauseous or you vomit.  You develop a bad smelling discharge from your vagina. Get help right away if:  You have blood clots or bleeding that is heavier than a normal period. Bleeding that soaks a pad in less than 1 hour is considered heavy bleeding.  You have a fever.  You have increasing cramps.  You faint.  You have pain when you urinate.  You have severe or worsening pain.  Your pain is not relieved when you take medicine.  You have bloody urine.  You vomit. Summary  After the procedure, it is common to have cramps and dark or bloody discharge from your vagina.  Do not douche, use tampons, or have sex until your health care provider says it is okay.  Follow all other activity restrictions as told by your health care provider. This information is not intended to replace advice given to you by your health care provider. Make sure you discuss any questions you have with your health care provider. Document Released: 03/02/2005 Document Revised: 03/04/2016 Document Reviewed: 03/04/2016 Elsevier Interactive Patient Education  2017 [place] Anesthesia Home Care Instructions  Activity: Get plenty of rest for the remainder of the day. A responsible individual must stay with you for 24 hours following the procedure.  For the next 24 hours, DO NOT: -Drive a car -Paediatric nurse -Drink alcoholic beverages -Take any medication  unless instructed by your physician -Make any legal decisions or sign important papers.  Meals: Start with liquid foods such as gelatin or soup. Progress to regular foods as tolerated. Avoid greasy, spicy, heavy foods. If nausea and/or vomiting occur, drink only clear liquids until the nausea and/or vomiting subsides. Call your physician if vomiting continues.  Special Instructions/Symptoms: Your throat may feel dry or  sore from the anesthesia or the breathing tube placed in your throat during surgery. If this causes discomfort, gargle with warm salt water. The discomfort should disappear within 24 hours.  If you had a scopolamine patch placed behind your ear for the management of post- operative nausea and/or vomiting:  1. The medication in the patch is effective for 72 hours, after which it should be removed.  Wrap patch in a tissue and discard in the trash. Wash hands thoroughly with soap and water. 2. You may remove the patch earlier than 72 hours if you experience unpleasant side effects which may include dry mouth, dizziness or visual disturbances. 3. Avoid touching the patch. Wash your hands with soap and water after contact with the patch.     NO IBUPROFEN PRODUCTS (MOTRIN, ADVIL) OR ALEVE UNTIL 5:30PM TODAY.

## 2018-01-13 NOTE — Interval H&P Note (Signed)
History and Physical Interval Note:  01/13/2018 8:36 AM  Rachel Trevino  has presented today for surgery, with the diagnosis of CERVICAL AND VAGINAL DYSPLASIA  The various methods of treatment have been discussed with the patient and family. After consideration of risks, benefits and other options for treatment, the patient has consented to  Procedure(s) with comments: CONIZATION CERVIX (N/A) LASER ABLATION  OF VAGINA (N/A) - Need omin Laser for procedure. [name] spoke to [place] and I called office to let them that they needed to call [employer] to schedule rep to bring omni laser. Rep # [phone] or [phone] [name]. If unable to reach [name] may have another contact number for [employer] as a surgical intervention .  The patient's history has been reviewed, patient examined, no change in status, stable for surgery.  I have reviewed the patient's chart and labs.  Questions were answered to the patient's satisfaction.     Isabel Caprice

## 2018-01-13 NOTE — Op Note (Signed)
OPERATIVE NOTE 01/13/18  Surgeon: Mart Piggs, MD  Assistants: none  Anesthesia: General LMA anesthesia  Pre-operative Diagnosis:   1. Cervical dysplasia 2. Vaginal dysplasia . Post-operative Diagnosis:  1. Cervical dysplasia 2. Vaginal dysplasia  Operation:  1. Cervical conization and endocervical curettage 2. Laser vaporization of vagina  Operative Findings:  Fusion of cervix to vaginal walls. At 3:00 there is a stricture forming that obscures the fornix. The entire vaginal apex/fornices had decreased Lugol's uptake. The initial part of the cone seemed to excise mostly ectocervix as once removed a more prominent cervical os was found. Therefore additional deeper endocervix was taken and labeled as deep anterior and deep posterior cervix.  Estimated Blood Loss:  Minimal             Specimens: cervical cone tagged at 12:00, endocervical curettage. Anterior deep cervix. Posterior deep cervix.          Complications:  None; patient tolerated the procedure well.         Disposition: PACU - hemodynamically stable.  Procedure Details: The patient was then taken to the operating room and placed in the supine position with SCD hose on. General anesthesia was then induced without difficulty. She was then placed in the dorsolithotomy position. The perineum was prepped with Betadine. The vagina was gently prepped with Betadine. The patient was then draped after the prep was dried. An in and out catheterization to empty the bladder was performed under sterile conditions.  Timeout was performed.  The weighted speculum was placed in the posterior vagina. The right angle retractor was placed anteriorally to visualize the cervix. Findings as noted. A 0-vicryl suture on a UR-6 needle was used to place stay sutures (which were tagged) at 3 and 9 o'clock of the cervicovaginal junction. 1% lidocaine with epinephrine was infiltrated at 2, 4, 8, 10 o'clock circumferentially around the face of  the cervix, and deep in the endocervical canal. An 11 blade scalpel on a long knife handle was used to make an incision around the face of the cervix, inside of the stay sutures, circumferentially. A tenaculum was used to grasp the specimen to manipulate it. The incision was then continued with Mayo scissors by angling towards the endocervix to amputate the specimen. It was removed, oriented with a marking stitch at the 12 o'clock ectocervix and sent to pathology. See findings for description of additional cervical tissue taken anterior/posterior deep.   A post-cone ECC was collected from the endocervical canal using a Box curette.  The bovie with the 33mm ball attachment was used at 50 coag to create hemostasis at the surgical bed.  Test spot on tongue depressor performed. Laser of the vagina was then performed on continuous setting with wattage of 8. Routine laser precautions and suction were used. The vaginal fornices were treated with Lugol's and the areas of decreased uptake targeted.  Monsels soaked Surgicell was applied to the surgical bed to consolidate this hemostasis.  All instrument, suture, laparotomy, Ray-Tec, and needle counts were correct x2. The patient tolerated the procedure well and was taken recovery room in stable condition.

## 2018-01-13 NOTE — Anesthesia Postprocedure Evaluation (Signed)
Anesthesia Post Note  Patient: Rachel Trevino  Procedure(s) Performed: CONIZATION CERVIX (N/A Cervix) LASER ABLATION  OF VAGINA (N/A Vagina )     Patient location during evaluation: PACU Anesthesia Type: General Level of consciousness: awake and alert, oriented and patient cooperative Pain management: pain level controlled Vital Signs Assessment: post-procedure vital signs reviewed and stable Respiratory status: spontaneous breathing, nonlabored ventilation and respiratory function stable Cardiovascular status: blood pressure returned to baseline and stable Postop Assessment: no apparent nausea or vomiting Anesthetic complications: no    Last Vitals:  Vitals:   01/13/18 1230 01/13/18 1330  BP: 133/79 126/71  Pulse: 60 61  Resp: 16 16  Temp:  36.5 C  SpO2: 97% 100%    Last Pain:  Vitals:   01/13/18 1330  TempSrc:   PainSc: 4                  Wilena Tyndall,E. Aluna Whiston

## 2018-01-13 NOTE — Anesthesia Preprocedure Evaluation (Signed)
Anesthesia Evaluation  Patient identified by MRN, date of birth, ID band Patient awake    Reviewed: Allergy & Precautions, NPO status , Patient's Chart, lab work & pertinent test results  History of Anesthesia Complications (+) DIFFICULT AIRWAY  Airway Mallampati: IV  TM Distance: >3 FB Neck ROM: Full    Dental  (+) Dental Advisory Given   Pulmonary neg pulmonary ROS,    breath sounds clear to auscultation       Cardiovascular negative cardio ROS   Rhythm:Regular Rate:Normal     Neuro/Psych negative neurological ROS     GI/Hepatic negative GI ROS, Neg liver ROS,   Endo/Other  negative endocrine ROS  Renal/GU negative Renal ROS     Musculoskeletal H/o sarcoma   Abdominal   Peds  Hematology negative hematology ROS (+)   Anesthesia Other Findings   Reproductive/Obstetrics                             Anesthesia Physical Anesthesia Plan  ASA: II  Anesthesia Plan: General   Post-op Pain Management:    Induction: Intravenous  PONV Risk Score and Plan: 3  Airway Management Planned: LMA  Additional Equipment:   Intra-op Plan:   Post-operative Plan:   Informed Consent: I have reviewed the patients History and Physical, chart, labs and discussed the procedure including the risks, benefits and alternatives for the proposed anesthesia with the patient or authorized representative who has indicated his/her understanding and acceptance.   Dental advisory given  Plan Discussed with: CRNA and Surgeon  Anesthesia Plan Comments: (Plan routine monitors, GA- LMA OK)        Anesthesia Quick Evaluation

## 2018-01-13 NOTE — Transfer of Care (Signed)
Immediate Anesthesia Transfer of Care Note  Patient: Rachel Trevino  Procedure(s) Performed: CONIZATION CERVIX (N/A Cervix) LASER ABLATION  OF VAGINA (N/A Vagina )  Patient Location: PACU  Anesthesia Type:General  Level of Consciousness: awake, alert  and oriented  Airway & Oxygen Therapy: Patient Spontanous Breathing and Patient connected to nasal cannula oxygen  Post-op Assessment: Report given to RN and Post -op Vital signs reviewed and stable  Post vital signs: Reviewed and stable  Last Vitals:  Vitals Value Taken Time  BP    Temp    Pulse 97 01/13/2018 11:33 AM  Resp    SpO2 100 % 01/13/2018 11:33 AM  Vitals shown include unvalidated device data.  Last Pain:  Vitals:   01/13/18 0853  TempSrc:   PainSc: 0-No pain      Patients Stated Pain Goal: 4 (84/16/60 6301)  Complications: No apparent anesthesia complications

## 2018-01-14 ENCOUNTER — Telehealth: Payer: Self-pay

## 2018-01-14 ENCOUNTER — Encounter (HOSPITAL_BASED_OUTPATIENT_CLINIC_OR_DEPARTMENT_OTHER): Payer: Self-pay | Admitting: Obstetrics

## 2018-01-14 NOTE — Telephone Encounter (Signed)
ToldMs Fergeson that the cervix cone showed cervical pre-cancer cell but no cancer. All the other biopsies were negative/benign per Joylene John, NP. Dr. Gerarda Fraction will review the pathology report and go over plan of care at post-op appointment on 01-26-18.

## 2018-01-26 ENCOUNTER — Inpatient Hospital Stay: Payer: Medicare Other | Attending: Obstetrics | Admitting: Obstetrics

## 2018-01-26 ENCOUNTER — Encounter: Payer: Self-pay | Admitting: Obstetrics

## 2018-01-26 VITALS — BP 136/93 | HR 60 | Temp 98.4°F | Resp 20 | Ht 64.0 in | Wt 172.3 lb

## 2018-01-26 DIAGNOSIS — D069 Carcinoma in situ of cervix, unspecified: Secondary | ICD-10-CM | POA: Insufficient documentation

## 2018-01-26 DIAGNOSIS — Z9889 Other specified postprocedural states: Secondary | ICD-10-CM

## 2018-01-26 NOTE — Progress Notes (Signed)
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

## 2018-01-26 NOTE — Patient Instructions (Signed)
Nothing per vagina until return Return in ~one month to check the cervical healing

## 2018-02-28 ENCOUNTER — Inpatient Hospital Stay: Payer: Medicare Other | Attending: Obstetrics | Admitting: Obstetrics

## 2018-02-28 ENCOUNTER — Encounter: Payer: Self-pay | Admitting: Obstetrics

## 2018-02-28 VITALS — BP 152/85 | HR 59 | Temp 98.4°F | Resp 20 | Ht 64.0 in | Wt 175.0 lb

## 2018-02-28 DIAGNOSIS — D069 Carcinoma in situ of cervix, unspecified: Secondary | ICD-10-CM | POA: Insufficient documentation

## 2018-02-28 DIAGNOSIS — Z9889 Other specified postprocedural states: Secondary | ICD-10-CM

## 2018-02-28 NOTE — Patient Instructions (Signed)
Plan to follow up in six months with a pap smear at that time or sooner if needed. Please call for any questions, concerns, or new symptoms.

## 2018-03-01 ENCOUNTER — Other Ambulatory Visit: Payer: Self-pay | Admitting: Endocrinology

## 2018-03-01 NOTE — Progress Notes (Signed)
S:No GYN related complaints. Notes leg swelling at night.  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 At 3:00 some granulation tissue which I treated with silver nitrate.  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

## 2018-03-29 ENCOUNTER — Other Ambulatory Visit: Payer: Self-pay | Admitting: Family Medicine

## 2018-03-29 DIAGNOSIS — R6889 Other general symptoms and signs: Secondary | ICD-10-CM

## 2018-04-13 ENCOUNTER — Ambulatory Visit (INDEPENDENT_AMBULATORY_CARE_PROVIDER_SITE_OTHER): Payer: Medicare Other | Admitting: Endocrinology

## 2018-04-13 ENCOUNTER — Encounter: Payer: Self-pay | Admitting: Endocrinology

## 2018-04-13 VITALS — BP 132/88 | HR 66 | Ht 64.0 in | Wt 171.8 lb

## 2018-04-13 DIAGNOSIS — E0590 Thyrotoxicosis, unspecified without thyrotoxic crisis or storm: Secondary | ICD-10-CM

## 2018-04-13 LAB — TSH: TSH: 1.87 u[IU]/mL (ref 0.35–4.50)

## 2018-04-13 LAB — T4, FREE: Free T4: 0.72 ng/dL (ref 0.60–1.60)

## 2018-04-13 NOTE — Progress Notes (Signed)
Subjective:    Patient ID: Rachel Trevino, female    DOB: 08-Nov-1950, 68 y.o.   MRN: [id number]  HPI Pt returns for f/u of hyperthyroidism (multinodular goiter was dx'ed in 2012, and hyperthyroidism in 2018; US showed multinodular goiter; she chose tapzole rx).  Since on the tapazole, pt states she feels well in general.   Past Medical History:  Diagnosis Date  . Arthritis    oa right leg  . Complication of anesthesia 2012   needed fiber optic,  scope for good vocal cord view, pt does not have letter from [facility]  on chart  . Lymphedema 2012   left leg  . 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 APPENDECTOMY     open. denies long hospitilization with procedure  . CERVICAL CONIZATION W/BX N/A 01/13/2018   Procedure: CONIZATION CERVIX;  Surgeon: Isabel Caprice, MD;  Location: [facility];  Service: Gynecology;  Laterality: N/A;  . LASER ABLATION CONDOLAMATA N/A 01/13/2018   Procedure: LASER ABLATION  OF VAGINA;  Surgeon: Isabel Caprice, MD;  Location: [facility];  Service: Gynecology;  Laterality: N/A;  Need omin Laser for procedure. [name] spoke to [place] and I called office to let them that they needed to call [employer] to schedule rep to bring omni laser. Rep # [phone] or [phone] [name]. If unable to reach [name] may have a  . left leg surgery for sarcoma  2012   and radiation  . TONSILLECTOMY    . TOTAL HIP ARTHROPLASTY Right 03/29/2015   Procedure: RIGHT TOTAL HIP ARTHROPLASTY ANTERIOR APPROACH;  Surgeon: Mcarthur Rossetti, MD;  Location: [facility];  Service: Orthopedics;  Laterality: Right;  . TUBAL LIGATION      Social History   Socioeconomic History  . Marital status: Married    Spouse name: Not on file  . Number of children: Not on file  . Years of education: Not on file  . Highest education level: Not on file   Occupational History  . Not on file  Social Needs  . Financial resource strain: Not on file  . Food insecurity:    Worry: Not on file    Inability: Not on file  . Transportation needs:    Medical: Not on file    Non-medical: Not on file  Tobacco Use  . Smoking status: Never Smoker  . Smokeless tobacco: Never Used  Substance and Sexual Activity  . Alcohol use: Yes    Comment: 1/week  . Drug use: No  . Sexual activity: Not on file  Lifestyle  . Physical activity:    Days per week: Not on file    Minutes per session: Not on file  . Stress: Not on file  Relationships  . Social connections:    Talks on phone: Not on file    Gets together: Not on file    Attends religious service: Not on file    Active member of club or organization: Not on file    Attends meetings of clubs or organizations: Not on file    Relationship status: Not on file  . Intimate partner violence:    Fear of current or ex partner: Not on file    Emotionally abused: Not on file    Physically abused: Not on file    Forced sexual activity: Not on file  Other Topics  Concern  . Not on file  Social History Narrative  . Not on file    Current Outpatient Medications on File Prior to Visit  Medication Sig Dispense Refill  . Glucosamine HCl-MSM (GLUCOSAMINE-MSM PO) Take 1 tablet by mouth as needed.     . Melatonin 3 MG TABS Take by mouth at bedtime as needed.    . methimazole (TAPAZOLE) 5 MG tablet TAKE 1 TABLET (5 MG TOTAL) DAILY BY MOUTH. 90 tablet 1  . Multiple Vitamin (MULTIVITAMIN) tablet Take 1 tablet by mouth daily.    Marland Kitchen OVER THE COUNTER MEDICATION Take 1 capsule by mouth daily. Prevagen for memory    . TRAVATAN Z 0.004 % SOLN ophthalmic solution INSTILL 1 DROP INTO EACH  EYE ONCE DAILY IN THE EVENING  12   No current facility-administered medications on file prior to visit.     No Known Allergies  Family History  Problem Relation Age of Onset  . Alzheimer's disease Mother   . Alzheimer's  disease Father   . Alzheimer's disease Sister   . Breast cancer Sister   . Parkinson's disease Brother   . Depression Sister   . Thyroid disease Neg Hx     BP 132/88 (BP Location: Right Arm, Patient Position: Sitting, Cuff Size: Normal)   Pulse 66   Ht 5\' 4"  (1.626 m)   Wt 171 lb 12.8 oz (77.9 kg)   SpO2 96%   BMI 29.49 kg/m   Review of Systems Denies fever.     Objective:   Physical Exam VITAL SIGNS:  See vs page GENERAL: no distress NECK: 2-3 cm right thyroid nodule is again noted.     Lab Results  Component Value Date   TSH 1.87 04/13/2018      Assessment & Plan:  Hyperthyroidism: well-controlled. Please continue the same medications Thyroid nodule: clinically stable.  We'll follow.   Patient Instructions  Thyroid blood tests are requested for you today.  We'll let you know about the results.    If ever you have fever while taking methimazole, stop it and call us, even if the reason is obvious, because of the risk of a rare side-effect.   Please come back for a follow-up appointment in 6 months.  If you change your mind, we can do the radioactive iodine pill in the future.

## 2018-04-13 NOTE — Patient Instructions (Signed)
Thyroid blood tests are requested for you today.  We'll let you know about the results.    If ever you have fever while taking methimazole, stop it and call us, even if the reason is obvious, because of the risk of a rare side-effect.   Please come back for a follow-up appointment in 6 months.  If you change your mind, we can do the radioactive iodine pill in the future.

## 2018-04-14 ENCOUNTER — Other Ambulatory Visit: Payer: Medicare Other

## 2018-04-18 ENCOUNTER — Ambulatory Visit
Admission: RE | Admit: 2018-04-18 | Discharge: 2018-04-18 | Disposition: A | Discharge status: Home / Self Care | Payer: Medicare Other | Source: Ambulatory Visit | Attending: Family Medicine | Admitting: Family Medicine

## 2018-04-18 DIAGNOSIS — R6889 Other general symptoms and signs: Secondary | ICD-10-CM

## 2018-05-26 ENCOUNTER — Telehealth: Payer: Self-pay | Admitting: *Deleted

## 2018-05-26 NOTE — Telephone Encounter (Signed)
Called and rescheduled the patient to see Dr. Alycia Rossetti on 4/8. Patient was to see Dr. Gerarda Fraction on 6/19, moved up patient having pain

## 2018-06-01 ENCOUNTER — Telehealth: Payer: Self-pay | Admitting: *Deleted

## 2018-06-01 NOTE — Telephone Encounter (Signed)
Patient called back and cancelled her appt for tomorrow. Patient to keep appt for 4/8, explained that we may have to call and move that appt.

## 2018-06-01 NOTE — Telephone Encounter (Signed)
Called patient and scheduled her to see Dr. Skeet Latch tomorrow.

## 2018-06-01 NOTE — Telephone Encounter (Signed)
Per Melissa APP called and spoke with the patient regarding her pain. Patient stated "the pain isn't constant, it comes and goes. The pain is below my stomach and the pelvic area. I have no bleeding, problems with my bowels or bladder. I don't have to take anything for pain. The doctor told me that if I start having a feeling that is different to let you know."

## 2018-06-02 ENCOUNTER — Ambulatory Visit: Payer: Medicare Other | Admitting: Gynecologic Oncology

## 2018-06-09 ENCOUNTER — Telehealth: Payer: Self-pay | Admitting: *Deleted

## 2018-06-09 NOTE — Telephone Encounter (Signed)
Called and spoke with the patient regarding her appt on 4/8. Moved the appt to 6/2 due to the COVID-19.

## 2018-06-22 ENCOUNTER — Ambulatory Visit: Payer: Medicare Other | Admitting: Gynecologic Oncology

## 2018-08-15 NOTE — Progress Notes (Signed)
Consult Note: Gyn-Onc  Rachel Trevino 68 y.o. female  CC:  Chief Complaint  Patient presents with  . Carcinoma in situ of exocervix    HPI: Consult was requested by Dr. 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    She started following Dr. Simona Huh ~2018 and was found to have an abnormal pap that was cleared with prior workup. This year however, her Pap again showed an abnormality and the biopsies were showing results that needed further management. She had a lesion at 9:00 on the cervix with biopsy showing CIN3 and by Lugol's some extension to the vaginal fornix.  10/19: Operation:  1. Cervical conization and endocervical curettage 2. Laser vaporization of vagina  Diagnosis 1. Cervix, cone - FOCAL HIGH GRADE SQUAMOUS INTRAEPITHELIAL LESION, CIN-III (SEVERE DYSPLASIA/CIS). - RESECTION MARGINS ARE NEGATIVE FOR HIGH GRADE DYSPLASIA. 2. Cervix, polyp, anterior - BENIGN ENDOCERVICAL POLYP. 3. Cervix, polyp, anterior deep - BENIGN ENDOCERVICAL POLYP. 4. Cervix, polyp, posterior deep - BENIGN ENDOCERVICAL POLYP. 5. Endocervix, curettage - BENIGN LOWER UTERINE SEGMENT TYPE TISSUE.  Interval History:  She comes in today for a Pap smear.  She was last seen by Dr. Gerarda Fraction in December 2019.  She is overall doing very well and denies any complaints whatsoever.  The only issue is occasionally before she has to have a bowel movement she will experience a little bit of lower abdominal discomfort that is relieved after she has a bowel movement.  She did not have this previously.  She denies any vaginal bleeding or any other change in her bowel or bladder habits.  She has no new diagnoses since we last saw her.  There are no new medical problems in her family.  Review of Systems:  Constitutional: Denies fever. Cardiovascular: No chest pain, shortness of breath Pulmonary: No cough Gastro Intestinal: Reporting  intermittent lower abdominal soreness as above.  No nausea, vomiting, constipation, or diarrhea reported.  Genitourinary: No change in bladder habits. Denies vaginal bleeding and discharge.  Psychology: No complaints  Current Meds:  Outpatient Encounter Medications as of 08/16/2018  Medication Sig  . Glucosamine HCl-MSM (GLUCOSAMINE-MSM PO) Take 1 tablet by mouth as needed.   . Melatonin 3 MG TABS Take by mouth at bedtime as needed.  . methimazole (TAPAZOLE) 5 MG tablet TAKE 1 TABLET (5 MG TOTAL) DAILY BY MOUTH.  . Multiple Vitamin (MULTIVITAMIN) tablet Take 1 tablet by mouth daily.  Marland Kitchen OVER THE COUNTER MEDICATION Take 1 capsule by mouth daily. Prevagen for memory  . TRAVATAN Z 0.004 % SOLN ophthalmic solution INSTILL 1 DROP INTO EACH  EYE ONCE DAILY IN THE EVENING   No facility-administered encounter medications on file as of 08/16/2018.     Allergy: No Known Allergies  Social Hx:   Social History   Socioeconomic History  . Marital status: Married    Spouse name: Not on file  . Number of children: Not on file  . Years of education: Not on file  . Highest education level: Not on file  Occupational History  . Not on file  Social Needs  . Financial resource strain: Not on file  . Food insecurity:    Worry: Not on file    Inability: Not on file  . Transportation needs:    Medical: Not on file    Non-medical: Not on file  Tobacco Use  . Smoking status: Never Smoker  . Smokeless tobacco: Never Used  Substance and Sexual Activity  . Alcohol use: Yes    Comment: 1/week  . Drug use: No  . Sexual activity: Not on file  Lifestyle  . Physical activity:    Days per week: Not on file    Minutes per session: Not on file  . Stress: Not on file  Relationships  . Social connections:    Talks on phone: Not on file    Gets together: Not on file    Attends religious service: Not on file    Active member of club or organization: Not on file    Attends meetings of clubs or  organizations: Not on file    Relationship status: Not on file  . Intimate partner violence:    Fear of current or ex partner: Not on file    Emotionally abused: Not on file    Physically abused: Not on file    Forced sexual activity: Not on file  Other Topics Concern  . Not on file  Social History Narrative  . Not on file    Past Surgical Hx:  Past Surgical History:  Procedure Laterality Date  . APPENDECTOMY     open. denies long hospitilization with procedure  . CERVICAL CONIZATION W/BX N/A 01/13/2018   Procedure: CONIZATION CERVIX;  Surgeon: Isabel Caprice, MD;  Location: [facility];  Service: Gynecology;  Laterality: N/A;  . LASER ABLATION CONDOLAMATA N/A 01/13/2018   Procedure: LASER ABLATION  OF VAGINA;  Surgeon: Isabel Caprice, MD;  Location: [facility];  Service: Gynecology;  Laterality: N/A;  Need omin Laser for procedure. [name] spoke to [place] and I called office to let them that they needed to call [employer] to schedule rep to bring omni laser. Rep # [id number] or [phone] [name]. If unable to reach [name] may have a  . left leg surgery for sarcoma  2012   and radiation  . TONSILLECTOMY    . TOTAL HIP ARTHROPLASTY Right 03/29/2015   Procedure: RIGHT TOTAL HIP ARTHROPLASTY ANTERIOR APPROACH;  Surgeon: Mcarthur Rossetti, MD;  Location: [facility];  Service: Orthopedics;  Laterality: Right;  . TUBAL LIGATION      Past Medical Hx:  Past Medical History:  Diagnosis Date  . Arthritis    oa right leg  . Complication of anesthesia 2012   needed fiber optic,  scope for good vocal cord view, pt does not have letter from [facility]  on chart  . Lymphedema 2012   left leg  . 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    Family Hx:  Family History  Problem Relation Age of Onset  . Alzheimer's disease Mother   . Alzheimer's disease Father   .  Alzheimer's disease Sister   . Breast cancer Sister   . Parkinson's disease Brother   . Depression Sister   . Thyroid disease Neg Hx     Vitals:  Blood pressure 138/83, pulse 62, temperature 97.9 F (36.6 C), temperature source Oral, resp. rate 18, height 5\' 4"  (1.626 m), weight 168 lb 14.4 oz (76.6 kg), SpO2 100 %.  Physical Exam:  Well-nourished well-developed female in no acute distress.  Pulpit: External genitalia within normal limits.  The vagina is markedly atrophic.  The cervix is rather flush with the vagina.  I can see the dimple of the cervical loss.  There are no visible lesions.  There is no discharge.  Pap smear was submitted without difficulty.  Bimanual examination reveals the cervix to be palpably normal.  The uterus is of normal size shape and consistency.  There are no adnexal masses.  Assessment/Plan:  68 year old with cervical and vaginal dysplasia status post conization and laser vaporization in December 2019.  I will follow-up in the results of her Pap smear from today.  If her Pap smear is normal she return to see [place] in 6 months.  Dessa Ledee A., MD 08/16/2018, 12:13 PM

## 2018-08-16 ENCOUNTER — Other Ambulatory Visit: Payer: Self-pay

## 2018-08-16 ENCOUNTER — Other Ambulatory Visit (HOSPITAL_COMMUNITY)
Admission: RE | Admit: 2018-08-16 | Discharge: 2018-08-16 | Disposition: A | Discharge status: Home / Self Care | Payer: Medicare Other | Source: Ambulatory Visit | Attending: Obstetrics | Admitting: Obstetrics

## 2018-08-16 ENCOUNTER — Inpatient Hospital Stay: Payer: Medicare Other | Attending: Obstetrics | Admitting: Gynecologic Oncology

## 2018-08-16 ENCOUNTER — Encounter: Payer: Self-pay | Admitting: Gynecologic Oncology

## 2018-08-16 ENCOUNTER — Ambulatory Visit: Payer: Medicare Other | Admitting: Gynecologic Oncology

## 2018-08-16 VITALS — BP 138/83 | HR 62 | Temp 97.9°F | Resp 18 | Ht 64.0 in | Wt 168.9 lb

## 2018-08-16 DIAGNOSIS — D061 Carcinoma in situ of exocervix: Secondary | ICD-10-CM | POA: Insufficient documentation

## 2018-08-16 NOTE — Patient Instructions (Addendum)
We will notify you of the results of your Pap smear from today.  If your Pap smear is normal, return to see [place] in 6 months.  If at that time is unremarkable, we will release you to your gynecologist.  Please call our office in October 2020 at [phone] to schedule an appointment for December 2020.

## 2018-08-18 ENCOUNTER — Telehealth: Payer: Self-pay | Admitting: Oncology

## 2018-08-18 LAB — CYTOLOGY - PAP
Adequacy: ABSENT
Diagnosis: NEGATIVE

## 2018-08-18 NOTE — Telephone Encounter (Signed)
Called Rachel Trevino and advised her of normal pap smear results per Joylene John, NP.  She verbalized understanding and agreement.

## 2018-09-02 ENCOUNTER — Ambulatory Visit: Payer: Medicare Other | Admitting: Obstetrics

## 2018-10-12 ENCOUNTER — Ambulatory Visit: Payer: Medicare Other | Admitting: Endocrinology

## 2018-11-04 ENCOUNTER — Other Ambulatory Visit: Payer: Self-pay

## 2018-11-04 DIAGNOSIS — Z20822 Contact with and (suspected) exposure to covid-19: Secondary | ICD-10-CM

## 2018-11-05 LAB — NOVEL CORONAVIRUS, NAA: SARS-CoV-2, NAA: NOT DETECTED

## 2018-11-29 ENCOUNTER — Other Ambulatory Visit: Payer: Self-pay

## 2018-11-29 ENCOUNTER — Ambulatory Visit: Payer: Medicare Other | Admitting: Endocrinology

## 2018-11-29 ENCOUNTER — Encounter: Payer: Self-pay | Admitting: Endocrinology

## 2018-11-29 ENCOUNTER — Ambulatory Visit (INDEPENDENT_AMBULATORY_CARE_PROVIDER_SITE_OTHER): Payer: Medicare Other | Admitting: Endocrinology

## 2018-11-29 VITALS — BP 132/80 | HR 62 | Ht 64.0 in | Wt 174.6 lb

## 2018-11-29 DIAGNOSIS — E0590 Thyrotoxicosis, unspecified without thyrotoxic crisis or storm: Secondary | ICD-10-CM | POA: Diagnosis not present

## 2018-11-29 NOTE — Progress Notes (Signed)
Subjective:    Patient ID: Rachel Trevino, female    DOB: [birth year], 68 y.o.   MRN: [id number]  HPI Pt returns for f/u of hyperthyroidism (multinodular goiter was dx'ed in 2012, and hyperthyroidism in 2018; US showed multinodular goiter; she chose tapzole rx).  pt states she feels well in general. She says she seldom misses the methimazole.   Past Medical History:  Diagnosis Date  . Arthritis    oa right leg  . Complication of anesthesia 2012   needed fiber optic,  scope for good vocal cord view, pt does not have letter from [facility]  on chart  . Lymphedema 2012   left leg  . 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 APPENDECTOMY     open. denies long hospitilization with procedure  . CERVICAL CONIZATION W/BX N/A 01/13/2018   Procedure: CONIZATION CERVIX;  Surgeon: Isabel Caprice, MD;  Location: [facility];  Service: Gynecology;  Laterality: N/A;  . LASER ABLATION CONDOLAMATA N/A 01/13/2018   Procedure: LASER ABLATION  OF VAGINA;  Surgeon: Isabel Caprice, MD;  Location: [facility];  Service: Gynecology;  Laterality: N/A;  Need omin Laser for procedure. [name] spoke to [place] and I called office to let them that they needed to call [employer] to schedule rep to bring omni laser. Rep # [phone] or [phone] [name]. If unable to reach [name] may have a  . left leg surgery for sarcoma  2012   and radiation  . TONSILLECTOMY    . TOTAL HIP ARTHROPLASTY Right 03/29/2015   Procedure: RIGHT TOTAL HIP ARTHROPLASTY ANTERIOR APPROACH;  Surgeon: Mcarthur Rossetti, MD;  Location: [facility];  Service: Orthopedics;  Laterality: Right;  . TUBAL LIGATION      Social History   Socioeconomic History  . Marital status: Married    Spouse name: Not on file  . Number of children: Not on file  . Years of education: Not on file  . Highest education level: Not  on file  Occupational History  . Not on file  Social Needs  . Financial resource strain: Not on file  . Food insecurity    Worry: Not on file    Inability: Not on file  . Transportation needs    Medical: Not on file    Non-medical: Not on file  Tobacco Use  . Smoking status: Never Smoker  . Smokeless tobacco: Never Used  Substance and Sexual Activity  . Alcohol use: Yes    Comment: 1/week  . Drug use: No  . Sexual activity: Not on file  Lifestyle  . Physical activity    Days per week: Not on file    Minutes per session: Not on file  . Stress: Not on file  Relationships  . Social Herbalist on phone: Not on file    Gets together: Not on file    Attends religious service: Not on file    Active member of club or organization: Not on file    Attends meetings of clubs or organizations: Not on file    Relationship status: Not on file  . Intimate partner violence    Fear of current or ex partner: Not on file    Emotionally abused: Not on file    Physically abused: Not on file    Forced sexual activity: Not on file  Other Topics Concern  . Not on file  Social History Narrative  . Not on file    Current Outpatient Medications on File Prior to Visit  Medication Sig Dispense Refill  . Apoaequorin (PREVAGEN PO) Take 2 tablets by mouth daily.    Marland Kitchen aspirin EC 81 MG tablet Take 81 mg by mouth 3 (three) times a week.    . Glucosamine HCl-MSM (GLUCOSAMINE-MSM PO) Take 1 tablet by mouth as needed.     . Melatonin 5 MG TABS Take 1 tablet by mouth at bedtime as needed.    . methimazole (TAPAZOLE) 5 MG tablet TAKE 1 TABLET (5 MG TOTAL) DAILY BY MOUTH. 90 tablet 1  . Multiple Vitamin (MULTIVITAMIN) tablet Take 1 tablet by mouth daily.    . TRAVATAN Z 0.004 % SOLN ophthalmic solution INSTILL 1 DROP INTO EACH  EYE ONCE DAILY IN THE EVENING  12   No current facility-administered medications on file prior to visit.     No Known Allergies  Family History  Problem Relation  Age of Onset  . Alzheimer's disease Mother   . Alzheimer's disease Father   . Alzheimer's disease Sister   . Breast cancer Sister   . Parkinson's disease Brother   . Depression Sister   . Thyroid disease Neg Hx     BP 132/80 (BP Location: Left Arm, Patient Position: Sitting, Cuff Size: Normal)   Pulse 62   Ht 5\' 4"  (1.626 m)   Wt 174 lb 9.6 oz (79.2 kg)   SpO2 95%   BMI 29.97 kg/m    Review of Systems Denies fever.      Objective:   Physical Exam VITAL SIGNS:  See vs page GENERAL: no distress NECK: right thyroid nodule is palpable, approx 3 cm.  Lab Results  Component Value Date   TSH 2.32 11/29/2018       Assessment & Plan:  Hyperthyroidism: well-controlled MNG: clinically stable  Patient Instructions  Blood tests are requested for you today.  We'll let you know about the results.  If ever you have fever while taking methimazole, stop it and call us, even if the reason is obvious, because of the risk of a rare side-effect. It is best to never miss the medication.  However, if you do miss it, next best is to double up the next time.   If today's blood tests are normal, please come back for a follow-up appointment in 6 months.

## 2018-11-29 NOTE — Patient Instructions (Addendum)
Blood tests are requested for you today.  We'll let you know about the results.  If ever you have fever while taking methimazole, stop it and call us, even if the reason is obvious, because of the risk of a rare side-effect. It is best to never miss the medication.  However, if you do miss it, next best is to double up the next time.   If today's blood tests are normal, please come back for a follow-up appointment in 6 months.    

## 2018-11-30 LAB — TSH: TSH: 2.32 u[IU]/mL (ref 0.35–4.50)

## 2018-11-30 LAB — T4, FREE: Free T4: 0.69 ng/dL (ref 0.60–1.60)

## 2018-12-07 ENCOUNTER — Ambulatory Visit: Payer: Medicare Other | Admitting: Endocrinology

## 2018-12-28 ENCOUNTER — Other Ambulatory Visit: Payer: Self-pay | Admitting: Endocrinology

## 2019-05-31 ENCOUNTER — Ambulatory Visit: Payer: Medicare Other | Admitting: Endocrinology

## 2019-06-05 ENCOUNTER — Other Ambulatory Visit: Payer: Self-pay

## 2019-06-07 ENCOUNTER — Ambulatory Visit (INDEPENDENT_AMBULATORY_CARE_PROVIDER_SITE_OTHER): Payer: Medicare PPO | Admitting: Endocrinology

## 2019-06-07 ENCOUNTER — Other Ambulatory Visit: Payer: Self-pay

## 2019-06-07 ENCOUNTER — Encounter: Payer: Self-pay | Admitting: Endocrinology

## 2019-06-07 VITALS — BP 130/80 | HR 71 | Ht 64.0 in | Wt 167.0 lb

## 2019-06-07 DIAGNOSIS — E0590 Thyrotoxicosis, unspecified without thyrotoxic crisis or storm: Secondary | ICD-10-CM

## 2019-06-07 LAB — T4, FREE: Free T4: 0.66 ng/dL (ref 0.60–1.60)

## 2019-06-07 LAB — TSH: TSH: 1.63 u[IU]/mL (ref 0.35–4.50)

## 2019-06-07 NOTE — Progress Notes (Signed)
Subjective:    Patient ID: Rachel Trevino, female    DOB: 09-05-1950, 69 y.o.   MRN: [id number]  HPI Pt returns for f/u of hyperthyroidism (multinodular goiter was dx'ed in 2012, and hyperthyroidism in 2018; US showed multinodular goiter; she chose tapzole rx).  pt states she feels well in general. She says she seldom misses the methimazole.  Past Medical History:  Diagnosis Date  . Arthritis    oa right leg  . Complication of anesthesia 2012   needed fiber optic,  scope for good vocal cord view, pt does not have letter from [facility]  on chart  . Lymphedema 2012   left leg  . 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 APPENDECTOMY     open. denies long hospitilization with procedure  . CERVICAL CONIZATION W/BX N/A 01/13/2018   Procedure: CONIZATION CERVIX;  Surgeon: Isabel Caprice, MD;  Location: [facility];  Service: Gynecology;  Laterality: N/A;  . LASER ABLATION CONDOLAMATA N/A 01/13/2018   Procedure: LASER ABLATION  OF VAGINA;  Surgeon: Isabel Caprice, MD;  Location: [facility];  Service: Gynecology;  Laterality: N/A;  Need omin Laser for procedure. [name] spoke to [place] and I called office to let them that they needed to call [employer] to schedule rep to bring omni laser. Rep # [phone] or [phone] [name]. If unable to reach [name] may have a  . left leg surgery for sarcoma  2012   and radiation  . TONSILLECTOMY    . TOTAL HIP ARTHROPLASTY Right 03/29/2015   Procedure: RIGHT TOTAL HIP ARTHROPLASTY ANTERIOR APPROACH;  Surgeon: Mcarthur Rossetti, MD;  Location: [facility];  Service: Orthopedics;  Laterality: Right;  . TUBAL LIGATION      Social History   Socioeconomic History  . Marital status: Married    Spouse name: Not on file  . Number of children: Not on file  . Years of education: Not on file  . Highest education level: Not  on file  Occupational History  . Not on file  Tobacco Use  . Smoking status: Never Smoker  . Smokeless tobacco: Never Used  Substance and Sexual Activity  . Alcohol use: Yes    Comment: 1/week  . Drug use: No  . Sexual activity: Not on file  Other Topics Concern  . Not on file  Social History Narrative  . Not on file   Social Determinants of Health   Financial Resource Strain:   . Difficulty of Paying Living Expenses:   Food Insecurity:   . Worried About Charity fundraiser in the Last Year:   . Arboriculturist in the Last Year:   Transportation Needs:   . Film/video editor (Medical):   Marland Kitchen Lack of Transportation (Non-Medical):   Physical Activity:   . Days of Exercise per Week:   . Minutes of Exercise per Session:   Stress:   . Feeling of Stress :   Social Connections:   . Frequency of Communication with Friends and Family:   . Frequency of Social Gatherings with Friends and Family:   . Attends Religious Services:   . Active Member of Clubs or Organizations:   . Attends Archivist Meetings:   Marland Kitchen Marital Status:   Intimate Partner Violence:   . Fear of Current or Ex-Partner:   . Emotionally Abused:   .  Physically Abused:   . Sexually Abused:     Current Outpatient Medications on File Prior to Visit  Medication Sig Dispense Refill  . Apoaequorin (PREVAGEN PO) Take 2 tablets by mouth daily.    Marland Kitchen aspirin EC 81 MG tablet Take 81 mg by mouth 3 (three) times a week.    . Glucosamine HCl-MSM (GLUCOSAMINE-MSM PO) Take 1 tablet by mouth as needed.     . Melatonin 5 MG TABS Take 1 tablet by mouth at bedtime as needed.    . methimazole (TAPAZOLE) 5 MG tablet TAKE 1 TABLET (5 MG TOTAL) DAILY BY MOUTH. 90 tablet 1  . Multiple Vitamin (MULTIVITAMIN) tablet Take 1 tablet by mouth daily.    . TRAVATAN Z 0.004 % SOLN ophthalmic solution INSTILL 1 DROP INTO EACH  EYE ONCE DAILY IN THE EVENING  12   No current facility-administered medications on file prior to visit.      No Known Allergies  Family History  Problem Relation Age of Onset  . Alzheimer's disease Mother   . Alzheimer's disease Father   . Alzheimer's disease Sister   . Breast cancer Sister   . Parkinson's disease Brother   . Depression Sister   . Thyroid disease Neg Hx     BP 130/80   Pulse 71   Ht 5\' 4"  (1.626 m)   Wt 167 lb (75.8 kg)   SpO2 96%   BMI 28.67 kg/m    Review of Systems Denies fever    Objective:   Physical Exam VITAL SIGNS:  See vs page GENERAL: no distress NECK: thyroid is 3 times normal size, with MNG surface (R>L).  Lab Results  Component Value Date   TSH 1.63 06/07/2019       Assessment & Plan:  Hyperthyroidism: well-controlled.  Please continue the same medication MNG: I told pt RAI might help.    Patient Instructions  Blood tests are requested for you today.  We'll let you know about the results.  If ever you have fever while taking methimazole, stop it and call us, even if the reason is obvious, because of the risk of a rare side-effect. It is best to never miss the medication.  However, if you do miss it, next best is to double up the next time.   If today's blood tests are normal, please come back for a follow-up appointment in 6 months.    If you want, you could instead take the Radioactive iodine treatment pill.  It work like this: We would first check a thyroid "scan" (a special, but easy and painless type of thyroid x ray).  It works like this: you go to the x-ray department of the hospital to swallow a pill, which contains a miniscule amount of radiation.  You will not notice any symptoms from this.  You will go back to the x-ray department the next day, to lie down in front of a camera.  The results of this will be sent to me.   Based on the results, i hope to order for you a treatment pill of radioactive iodine.  Although it is a larger amount of radiation, you will again notice no symptoms from this.  The pill is gone from your body in  a few days (during which you should stay away from other people), but takes several months to work.  Therefore, please return here approximately 6-8 weeks after the treatment.  This treatment has been available for many years, and the only known  side-effect is an underactive thyroid.  It is possible that i would eventually prescribe for you a thyroid hormone pill, which is very inexpensive.  You don't have to worry about side-effects of this thyroid hormone pill, because it is the same molecule your thyroid makes.

## 2019-06-07 NOTE — Patient Instructions (Addendum)
Blood tests are requested for you today.  We'll let you know about the results.  If ever you have fever while taking methimazole, stop it and call us, even if the reason is obvious, because of the risk of a rare side-effect. It is best to never miss the medication.  However, if you do miss it, next best is to double up the next time.   If today's blood tests are normal, please come back for a follow-up appointment in 6 months.    If you want, you could instead take the Radioactive iodine treatment pill.  It work like this: We would first check a thyroid "scan" (a special, but easy and painless type of thyroid x ray).  It works like this: you go to the x-ray department of the hospital to swallow a pill, which contains a miniscule amount of radiation.  You will not notice any symptoms from this.  You will go back to the x-ray department the next day, to lie down in front of a camera.  The results of this will be sent to me.   Based on the results, i hope to order for you a treatment pill of radioactive iodine.  Although it is a larger amount of radiation, you will again notice no symptoms from this.  The pill is gone from your body in a few days (during which you should stay away from other people), but takes several months to work.  Therefore, please return here approximately 6-8 weeks after the treatment.  This treatment has been available for many years, and the only known side-effect is an underactive thyroid.  It is possible that i would eventually prescribe for you a thyroid hormone pill, which is very inexpensive.  You don't have to worry about side-effects of this thyroid hormone pill, because it is the same molecule your thyroid makes.

## 2019-06-12 ENCOUNTER — Ambulatory Visit (INDEPENDENT_AMBULATORY_CARE_PROVIDER_SITE_OTHER): Payer: Medicare PPO

## 2019-06-12 ENCOUNTER — Other Ambulatory Visit: Payer: Self-pay

## 2019-06-12 ENCOUNTER — Ambulatory Visit: Payer: Medicare PPO | Admitting: Podiatry

## 2019-06-12 DIAGNOSIS — M2012 Hallux valgus (acquired), left foot: Secondary | ICD-10-CM

## 2019-06-12 DIAGNOSIS — B351 Tinea unguium: Secondary | ICD-10-CM

## 2019-06-12 DIAGNOSIS — M79675 Pain in left toe(s): Secondary | ICD-10-CM | POA: Diagnosis not present

## 2019-06-12 DIAGNOSIS — M79674 Pain in right toe(s): Secondary | ICD-10-CM

## 2019-06-12 NOTE — Progress Notes (Signed)
Subjective:  Patient ID: Rachel Trevino, female    DOB: [birth year],  MRN: [id number]  Chief Complaint  Patient presents with  . Bunions    pt is here for a possible bunion of the left foot, pt states that she is looking for her left great toenail as well as left 3rd toenail to be trimmed    69 y.o. female presents with the above complaint.  Patient presents with primary complaint of left bunion deformity that is mildly painful in nature.  Patient states been going for years has progressively gotten worse.  It has gradually gotten worse over the last decade.  Patient has been wearing sneakers that wider in the toebox.  This seems to have helped her in terms of pain control.  She states however with different type of sneakers there is different levels of pain associated with it.  Patient also has a secondary complaint of thickened elongated dystrophic nails x10.  She would like to know if this could be debrided down.  She has not seen anyone else for this.  She would like to get more information on the bunion as well as onychomycosis.  She denies any other acute complaints.   Review of Systems: Negative except as noted in the HPI. Denies N/V/F/Ch.  Past Medical History:  Diagnosis Date  . Arthritis    oa right leg  . Complication of anesthesia 2012   needed fiber optic,  scope for good vocal cord view, pt does not have letter from [facility]  on chart  . Lymphedema 2012   left leg  . 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  Apoaequorin (PREVAGEN PO), Take 2 tablets by mouth daily., Disp: , Rfl:  .  aspirin EC 81 MG tablet, Take 81 mg by mouth 3 (three) times a week., Disp: , Rfl:  .  Glucosamine HCl-MSM (GLUCOSAMINE-MSM PO), Take 1 tablet by mouth as needed. , Disp: , Rfl:  .  Melatonin 5 MG TABS, Take 1 tablet by mouth at bedtime as needed., Disp: , Rfl:  .  methimazole (TAPAZOLE) 5 MG tablet, TAKE 1 TABLET (5 MG TOTAL)  DAILY BY MOUTH., Disp: 90 tablet, Rfl: 1 .  Multiple Vitamin (MULTIVITAMIN) tablet, Take 1 tablet by mouth daily., Disp: , Rfl:  .  TRAVATAN Z 0.004 % SOLN ophthalmic solution, INSTILL 1 DROP INTO EACH  EYE ONCE DAILY IN THE EVENING, Disp: , Rfl: 12  Social History   Tobacco Use  Smoking Status Never Smoker  Smokeless Tobacco Never Used    No Known Allergies Objective:  There were no vitals filed for this visit. There is no height or weight on file to calculate BMI. Constitutional Well developed. Well nourished.  Vascular Dorsalis pedis pulses palpable bilaterally. Posterior tibial pulses palpable bilaterally. Capillary refill normal to all digits.  No cyanosis or clubbing noted. Pedal hair growth normal.  Neurologic Normal speech. Oriented to person, place, and time. Epicritic sensation to light touch grossly present bilaterally.  Dermatologic Nail Exam: Pt has thick disfigured discolored nails with subungual debris noted bilateral entire nail hallux through fifth toenails No open wounds. No skin lesions.  Orthopedic:  Pain on palpation mildly to the left first metatarsophalangeal joint at the level of the hypertrophy of medial eminence.  This is a tracking deformity not track bound deformity.  Hallux valgus noted.  No intra-articular pain at the left first metatarsophalangeal joint noted.  Good  range of motion of the first metatarsophalangeal joint active and passive noted.   Radiographs: 3 views of skeletally mature adult foot: There is an increase in intermetatarsal angle appears to be mild to moderate in nature.  There is increase in hallux abductor's angle.  The sesamoid position is 5 out of 7.  Mild elevatus present of the first ray.  No other bony abnormalities identified. Assessment:   1. Hav (hallux abducto valgus), left    Plan:  Patient was evaluated and treated and all questions answered.  Left hallux valgus bunion deformity -I explained to the patient etiology of  bunion deformity and various treatment options were discussed with the patient in extensive detail.  Patient has a history of high heels which may have caused the relatively bunion to progressively gets worse.  However given that this is only mildly painful in nature which are not severe changes I will hold off on any treatment at this time.  Multiple treatments were discussed including wider toe shoe gear modification, orthotics, padding/protectors.  For now patient will try the conservative treatment and if her bunion pain ever progresses or worsens patient will think about surgery.  Onychomycosis with pain  -Nails palliatively debrided as below. -Educated on self-care  Procedure: Nail Debridement Rationale: pain  Type of Debridement: manual, sharp debridement. Instrumentation: Nail nipper, rotary burr. Number of Nails: 10  Procedures and Treatment: Consent by patient was obtained for treatment procedures. The patient understood the discussion of treatment and procedures well. All questions were answered thoroughly reviewed. Debridement of mycotic and hypertrophic toenails, 1 through 5 bilateral and clearing of subungual debris. No ulceration, no infection noted.  Return Visit-Office Procedure: Patient instructed to return to the office for a follow up visit 3 months for continued evaluation and treatment.  Boneta Lucks, DPM    No follow-ups on file.   No follow-ups on file.

## 2019-06-13 ENCOUNTER — Encounter: Payer: Self-pay | Admitting: Podiatry

## 2019-08-09 ENCOUNTER — Other Ambulatory Visit: Payer: Self-pay | Admitting: Endocrinology

## 2019-12-13 ENCOUNTER — Ambulatory Visit: Payer: Medicare PPO | Admitting: Endocrinology

## 2019-12-20 ENCOUNTER — Other Ambulatory Visit: Payer: Self-pay

## 2019-12-20 ENCOUNTER — Ambulatory Visit (INDEPENDENT_AMBULATORY_CARE_PROVIDER_SITE_OTHER): Payer: Medicare PPO | Admitting: Endocrinology

## 2019-12-20 ENCOUNTER — Encounter: Payer: Self-pay | Admitting: Endocrinology

## 2019-12-20 VITALS — BP 132/72 | HR 75 | Ht 64.0 in | Wt 166.0 lb

## 2019-12-20 DIAGNOSIS — E0590 Thyrotoxicosis, unspecified without thyrotoxic crisis or storm: Secondary | ICD-10-CM

## 2019-12-20 LAB — T4, FREE: Free T4: 0.61 ng/dL (ref 0.60–1.60)

## 2019-12-20 LAB — TSH: TSH: 2.15 u[IU]/mL (ref 0.35–4.50)

## 2019-12-20 NOTE — Progress Notes (Signed)
Subjective:    Patient ID: Rachel Trevino, female    DOB: 1951/01/12, 69 y.o.   MRN: [id number]  HPI Pt returns for f/u of hyperthyroidism (multinodular goiter was dx'ed in 2012, and hyperthyroidism in 2018; US showed multinodular goiter; she chose tapzole rx).  pt states she feels well in general. She says she does not miss the methimazole.  Past Medical History:  Diagnosis Date  . Arthritis    oa right leg  . Complication of anesthesia 2012   needed fiber optic,  scope for good vocal cord view, pt does not have letter from [facility]  on chart  . Lymphedema 2012   left leg  . 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 APPENDECTOMY     open. denies long hospitilization with procedure  . CERVICAL CONIZATION W/BX N/A 01/13/2018   Procedure: CONIZATION CERVIX;  Surgeon: Isabel Caprice, MD;  [facility];  Service: Gynecology;  Laterality: N/A;  . LASER ABLATION CONDOLAMATA N/A 01/13/2018   Procedure: LASER ABLATION  OF VAGINA;  Surgeon: Isabel Caprice, MD;  Location: [facility];  Service: Gynecology;  Laterality: N/A;  Need omin Laser for procedure. [name] spoke to [place] and I called office to let them that they needed to call [employer] to schedule rep to bring omni laser. Rep # [id number] or [phone] [name]. If unable to reach [name] may have a  . left leg surgery for sarcoma  2012   and radiation  . TONSILLECTOMY    . TOTAL HIP ARTHROPLASTY Right 03/29/2015   Procedure: RIGHT TOTAL HIP ARTHROPLASTY ANTERIOR APPROACH;  Surgeon: Mcarthur Rossetti, MD;  Location: [facility];  Service: Orthopedics;  Laterality: Right;  . TUBAL LIGATION      Social History   Socioeconomic History  . Marital status: Married    Spouse name: Not on file  . Number of children: Not on file  . Years of education: Not on file  . Highest education level: Not  on file  Occupational History  . Not on file  Tobacco Use  . Smoking status: Never Smoker  . Smokeless tobacco: Never Used  Substance and Sexual Activity  . Alcohol use: Yes    Comment: 1/week  . Drug use: No  . Sexual activity: Not on file  Other Topics Concern  . Not on file  Social History Narrative  . Not on file   Social Determinants of Health   Financial Resource Strain:   . Difficulty of Paying Living Expenses: Not on file  Food Insecurity:   . Worried About Charity fundraiser in the Last Year: Not on file  . Ran Out of Food in the Last Year: Not on file  Transportation Needs:   . Lack of Transportation (Medical): Not on file  . Lack of Transportation (Non-Medical): Not on file  Physical Activity:   . Days of Exercise per Week: Not on file  . Minutes of Exercise per Session: Not on file  Stress:   . Feeling of Stress : Not on file  Social Connections:   . Frequency of Communication with Friends and Family: Not on file  . Frequency of Social Gatherings with Friends and Family: Not on file  . Attends Religious Services: Not on file  . Active Member of Clubs or Organizations: Not on file  . Attends Club or  Organization Meetings: Not on file  . Marital Status: Not on file  Intimate Partner Violence:   . Fear of Current or Ex-Partner: Not on file  . Emotionally Abused: Not on file  . Physically Abused: Not on file  . Sexually Abused: Not on file    Current Outpatient Medications on File Prior to Visit  Medication Sig Dispense Refill  . Apoaequorin (PREVAGEN PO) Take 2 tablets by mouth daily.    Marland Kitchen aspirin EC 81 MG tablet Take 81 mg by mouth 3 (three) times a week.    . diclofenac Sodium (VOLTAREN) 1 % GEL     . Glucosamine HCl-MSM (GLUCOSAMINE-MSM PO) Take 1 tablet by mouth as needed.     . Melatonin 5 MG TABS Take 1 tablet by mouth at bedtime as needed.    . methimazole (TAPAZOLE) 5 MG tablet TAKE 1 TABLET (5 MG TOTAL) DAILY BY MOUTH. 90 tablet 1  . Multiple  Vitamin (MULTIVITAMIN) tablet Take 1 tablet by mouth daily.    . TRAVATAN Z 0.004 % SOLN ophthalmic solution INSTILL 1 DROP INTO EACH  EYE ONCE DAILY IN THE EVENING  12  . VYZULTA 0.024 % SOLN      No current facility-administered medications on file prior to visit.    No Known Allergies  Family History  Problem Relation Age of Onset  . Alzheimer's disease Mother   . Alzheimer's disease Father   . Alzheimer's disease Sister   . Breast cancer Sister   . Parkinson's disease Brother   . Depression Sister   . Thyroid disease Neg Hx     BP 132/72   Pulse 75   Ht 5\' 4"  (1.626 m)   Wt 166 lb (75.3 kg)   SpO2 98%   BMI 28.49 kg/m    Review of Systems Denies fever.      Objective:   Physical Exam VITAL SIGNS:  See vs page GENERAL: no distress NECK: 3 cm right thyroid nodule is again noted  Lab Results  Component Value Date   TSH 2.15 12/20/2019      Assessment & Plan:  Hyperthyroidism: well-controlled.  Please continue the same methimazole  Patient Instructions  Blood tests are requested for you today.  We'll let you know about the results.  If ever you have fever while taking methimazole, stop it and call us, even if the reason is obvious, because of the risk of a rare side-effect. It is best to never miss the medication.  However, if you do miss it, next best is to double up the next time.   If today's blood tests are normal, please come back for a follow-up appointment in 6 months.

## 2019-12-20 NOTE — Patient Instructions (Signed)
Blood tests are requested for you today.  We'll let you know about the results.  If ever you have fever while taking methimazole, stop it and call us, even if the reason is obvious, because of the risk of a rare side-effect. It is best to never miss the medication.  However, if you do miss it, next best is to double up the next time.   If today's blood tests are normal, please come back for a follow-up appointment in 6 months.

## 2019-12-25 ENCOUNTER — Other Ambulatory Visit: Payer: Medicare PPO

## 2019-12-25 DIAGNOSIS — Z20822 Contact with and (suspected) exposure to covid-19: Secondary | ICD-10-CM | POA: Diagnosis not present

## 2019-12-27 LAB — NOVEL CORONAVIRUS, NAA

## 2019-12-28 ENCOUNTER — Ambulatory Visit: Payer: Medicare PPO

## 2020-01-04 ENCOUNTER — Other Ambulatory Visit: Payer: Medicare PPO

## 2020-01-08 ENCOUNTER — Ambulatory Visit: Payer: Self-pay

## 2020-01-11 ENCOUNTER — Ambulatory Visit: Payer: Medicare PPO

## 2020-02-02 DIAGNOSIS — Z23 Encounter for immunization: Secondary | ICD-10-CM | POA: Diagnosis not present

## 2020-02-02 DIAGNOSIS — E2839 Other primary ovarian failure: Secondary | ICD-10-CM | POA: Diagnosis not present

## 2020-02-02 DIAGNOSIS — H409 Unspecified glaucoma: Secondary | ICD-10-CM | POA: Diagnosis not present

## 2020-02-02 DIAGNOSIS — E559 Vitamin D deficiency, unspecified: Secondary | ICD-10-CM | POA: Diagnosis not present

## 2020-02-02 DIAGNOSIS — E785 Hyperlipidemia, unspecified: Secondary | ICD-10-CM | POA: Diagnosis not present

## 2020-02-02 DIAGNOSIS — Z0000 Encounter for general adult medical examination without abnormal findings: Secondary | ICD-10-CM | POA: Diagnosis not present

## 2020-02-02 DIAGNOSIS — F3342 Major depressive disorder, recurrent, in full remission: Secondary | ICD-10-CM | POA: Diagnosis not present

## 2020-02-02 DIAGNOSIS — Z79899 Other long term (current) drug therapy: Secondary | ICD-10-CM | POA: Diagnosis not present

## 2020-02-02 DIAGNOSIS — D069 Carcinoma in situ of cervix, unspecified: Secondary | ICD-10-CM | POA: Diagnosis not present

## 2020-02-14 DIAGNOSIS — M85831 Other specified disorders of bone density and structure, right forearm: Secondary | ICD-10-CM | POA: Diagnosis not present

## 2020-02-14 DIAGNOSIS — Z780 Asymptomatic menopausal state: Secondary | ICD-10-CM | POA: Diagnosis not present

## 2020-02-14 DIAGNOSIS — M85852 Other specified disorders of bone density and structure, left thigh: Secondary | ICD-10-CM | POA: Diagnosis not present

## 2020-02-27 ENCOUNTER — Ambulatory Visit: Payer: Medicare PPO | Attending: Internal Medicine

## 2020-02-27 DIAGNOSIS — Z23 Encounter for immunization: Secondary | ICD-10-CM

## 2020-02-27 NOTE — Progress Notes (Signed)
   Covid-19 Vaccination Clinic  Name:  Rachel Trevino    MRN: [id number] DOB: 09-04-1950  02/27/2020  Rachel Trevino was observed post Covid-19 immunization for 15 minutes without incident. She was provided with Vaccine Information Sheet and instruction to access the V-Safe system.   Rachel Trevino was instructed to call 911 with any severe reactions post vaccine: Marland Kitchen Difficulty breathing  . Swelling of face and throat  . A fast heartbeat  . A bad rash all over body  . Dizziness and weakness   Immunizations Administered    Name Date Dose VIS Date Route   Pfizer COVID-19 Vaccine 02/27/2020  1:31 PM 0.3 mL 01/03/2020 Intramuscular   Manufacturer: [place]   Lot: [id number]   [place]: [id number]

## 2020-02-28 DIAGNOSIS — H401131 Primary open-angle glaucoma, bilateral, mild stage: Secondary | ICD-10-CM | POA: Diagnosis not present

## 2020-02-28 DIAGNOSIS — H43813 Vitreous degeneration, bilateral: Secondary | ICD-10-CM | POA: Diagnosis not present

## 2020-04-16 ENCOUNTER — Other Ambulatory Visit: Payer: Self-pay | Admitting: Endocrinology

## 2020-05-28 DIAGNOSIS — H43813 Vitreous degeneration, bilateral: Secondary | ICD-10-CM | POA: Diagnosis not present

## 2020-05-28 DIAGNOSIS — H401131 Primary open-angle glaucoma, bilateral, mild stage: Secondary | ICD-10-CM | POA: Diagnosis not present

## 2020-06-19 ENCOUNTER — Ambulatory Visit: Payer: Medicare PPO | Admitting: Endocrinology

## 2020-06-19 ENCOUNTER — Other Ambulatory Visit: Payer: Self-pay

## 2020-06-19 VITALS — BP 126/74 | HR 61 | Ht 64.0 in | Wt 169.4 lb

## 2020-06-19 DIAGNOSIS — E042 Nontoxic multinodular goiter: Secondary | ICD-10-CM

## 2020-06-19 LAB — T4, FREE: Free T4: 0.69 ng/dL (ref 0.60–1.60)

## 2020-06-19 LAB — TSH: TSH: 4.21 u[IU]/mL (ref 0.35–4.50)

## 2020-06-19 MED ORDER — METHIMAZOLE 5 MG PO TABS
5.0000 mg | ORAL_TABLET | Freq: Every day | ORAL | 3 refills | Status: DC
Start: 2020-06-19 — End: 2021-04-29

## 2020-06-19 NOTE — Patient Instructions (Signed)
Blood tests are requested for you today.  We'll let you know about the results.  If ever you have fever while taking methimazole, stop it and call us, even if the reason is obvious, because of the risk of a rare side-effect.  It is best to never miss the medication.  However, if you do miss it, next best is to double up the next time.   Let's recheck the ultrasound.  you will receive a phone call, about a day and time for an appointment.   please come back for a follow-up appointment in 6 months.

## 2020-06-19 NOTE — Progress Notes (Signed)
Subjective:    Patient ID: Rachel Trevino, female    DOB: [birth year], 70 y.o.   MRN: [id number]  HPI Pt returns for f/u of hyperthyroidism (multinodular goiter was dx'ed in 2012, and hyperthyroidism in 2018; US showed multinodular goiter; she chose tapzole rx).  pt states she feels well in general. She says she does not miss the methimazole.  Past Medical History:  Diagnosis Date  . Arthritis    oa right leg  . Complication of anesthesia 2012   needed fiber optic,  scope for good vocal cord view, pt does not have letter from [facility]  on chart  . Lymphedema 2012   left leg  . 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 APPENDECTOMY     open. denies long hospitilization with procedure  . CERVICAL CONIZATION W/BX N/A 01/13/2018   Procedure: CONIZATION CERVIX;  Surgeon: Isabel Caprice, MD;  Location: [facility];  Service: Gynecology;  Laterality: N/A;  . LASER ABLATION CONDOLAMATA N/A 01/13/2018   Procedure: LASER ABLATION  OF VAGINA;  Surgeon: Isabel Caprice, MD;  Location: [facility];  Service: Gynecology;  Laterality: N/A;  Need omin Laser for procedure. [name] spoke to [name] and I called office to let them that they needed to call [employer] to schedule rep to bring omni laser. Rep # [phone] or [phone] [name]. If unable to reach [name] may have a  . left leg surgery for sarcoma  2012   and radiation  . TONSILLECTOMY    . TOTAL HIP ARTHROPLASTY Right 03/29/2015   Procedure: RIGHT TOTAL HIP ARTHROPLASTY ANTERIOR APPROACH;  Surgeon: Mcarthur Rossetti, MD;  Location: [facility];  Service: Orthopedics;  Laterality: Right;  . TUBAL LIGATION      Social History   Socioeconomic History  . Marital status: Married    Spouse name: Not on file  . Number of children: Not on file  . Years of education: Not on file  . Highest education level: Not  on file  Occupational History  . Not on file  Tobacco Use  . Smoking status: Never Smoker  . Smokeless tobacco: Never Used  Substance and Sexual Activity  . Alcohol use: Yes    Comment: 1/week  . Drug use: No  . Sexual activity: Not on file  Other Topics Concern  . Not on file  Social History Narrative  . Not on file   Social Determinants of Health   Financial Resource Strain: Not on file  Food Insecurity: Not on file  Transportation Needs: Not on file  Physical Activity: Not on file  Stress: Not on file  Social Connections: Not on file  Intimate Partner Violence: Not on file    Current Outpatient Medications on File Prior to Visit  Medication Sig Dispense Refill  . Apoaequorin (PREVAGEN PO) Take 2 tablets by mouth daily.    Marland Kitchen aspirin EC 81 MG tablet Take 81 mg by mouth 3 (three) times a week.    . diclofenac Sodium (VOLTAREN) 1 % GEL     . Glucosamine HCl-MSM (GLUCOSAMINE-MSM PO) Take 1 tablet by mouth as needed.     . Melatonin 5 MG TABS Take 1 tablet by mouth at bedtime as needed.    . Multiple Vitamin (MULTIVITAMIN) tablet Take 1 tablet by mouth daily.    . TRAVATAN Z 0.004 % SOLN ophthalmic solution  INSTILL 1 DROP INTO EACH  EYE ONCE DAILY IN THE EVENING  12  . VYZULTA 0.024 % SOLN      No current facility-administered medications on file prior to visit.    No Known Allergies  Family History  Problem Relation Age of Onset  . Alzheimer's disease Mother   . Alzheimer's disease Father   . Alzheimer's disease Sister   . Breast cancer Sister   . Parkinson's disease Brother   . Depression Sister   . Thyroid disease Neg Hx     BP 126/74 (BP Location: Right Arm, Patient Position: Sitting, Cuff Size: Large)   Pulse 61   Ht 5\' 4"  (1.626 m)   Wt 169 lb 6.4 oz (76.8 kg)   SpO2 97%   BMI 29.08 kg/m    Review of Systems     Objective:   Physical Exam VITAL SIGNS:  See vs page GENERAL: no distress NECK: thyroid is slightly enlarged on the right, and normal  on the left.  No palpable lymphadenopathy at the anterior neck.  Lab Results  Component Value Date   TSH 4.21 06/19/2020      Assessment & Plan:  Hyperthyroidism: well-controlled.  Please continue the same methimazole.

## 2020-07-03 ENCOUNTER — Encounter: Payer: Self-pay | Admitting: Endocrinology

## 2020-08-16 DIAGNOSIS — Z23 Encounter for immunization: Secondary | ICD-10-CM | POA: Diagnosis not present

## 2020-10-08 DIAGNOSIS — H401131 Primary open-angle glaucoma, bilateral, mild stage: Secondary | ICD-10-CM | POA: Diagnosis not present

## 2020-10-08 DIAGNOSIS — H43813 Vitreous degeneration, bilateral: Secondary | ICD-10-CM | POA: Diagnosis not present

## 2020-12-25 ENCOUNTER — Other Ambulatory Visit: Payer: Self-pay

## 2020-12-25 ENCOUNTER — Ambulatory Visit: Payer: Medicare PPO | Admitting: Endocrinology

## 2020-12-25 VITALS — BP 122/70 | HR 63 | Ht 64.0 in | Wt 170.0 lb

## 2020-12-25 DIAGNOSIS — E0590 Thyrotoxicosis, unspecified without thyrotoxic crisis or storm: Secondary | ICD-10-CM | POA: Diagnosis not present

## 2020-12-25 DIAGNOSIS — E042 Nontoxic multinodular goiter: Secondary | ICD-10-CM | POA: Diagnosis not present

## 2020-12-25 LAB — T4, FREE: Free T4: 0.56 ng/dL — ABNORMAL LOW (ref 0.60–1.60)

## 2020-12-25 LAB — TSH: TSH: 2.63 u[IU]/mL (ref 0.35–5.50)

## 2020-12-25 NOTE — Progress Notes (Signed)
Subjective:    Patient ID: Rachel Trevino, female    DOB: May 05, 1950, 70 y.o.   MRN: [id number]  HPI Pt returns for f/u of hyperthyroidism (multinodular goiter was dx'ed in 2012, and hyperthyroidism in 2018; US showed multinodular goiter; she chose tapzole rx).  pt states she feels well in general. She says she does not miss the methimazole.  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 denies long hospitilization with procedure   CERVICAL CONIZATION W/BX N/A 01/13/2018   Procedure: CONIZATION CERVIX;  Surgeon: Isabel Caprice, MD;  Location: [facility];  Service: Gynecology;  Laterality: N/A;   LASER ABLATION CONDOLAMATA N/A 01/13/2018   Procedure: LASER ABLATION  OF VAGINA;  Surgeon: Isabel Caprice, MD;  Location: [facility];  Service: Gynecology;  Laterality: N/A;  Need omin Laser for procedure. [name] spoke to [place] and I called office to let them that they needed to call [employer] to schedule rep to bring omni laser. Rep # [id number] or [phone] [name]. If unable to reach [name] may have a   left leg surgery for sarcoma  2012   and radiation   TONSILLECTOMY     TOTAL HIP ARTHROPLASTY Right 03/29/2015   Procedure: RIGHT TOTAL HIP ARTHROPLASTY ANTERIOR APPROACH;  Surgeon: Mcarthur Rossetti, MD;  Location: [facility];  Service: Orthopedics;  Laterality: Right;   TUBAL LIGATION      Social History   Socioeconomic History   Marital status: Married    Spouse name: Not on file   Number of children: Not on file   Years of education: Not on file   Highest education level: Not on file   Occupational History   Not on file  Tobacco Use   Smoking status: Never   Smokeless tobacco: Never  Substance and Sexual Activity   Alcohol use: Yes    Comment: 1/week   Drug use: No   Sexual activity: Not on file  Other Topics Concern   Not on file  Social History Narrative   Not on file   Social Determinants of Health   Financial Resource Strain: Not on file  Food Insecurity: Not on file  Transportation Needs: Not on file  Physical Activity: Not on file  Stress: Not on file  Social Connections: Not on file  Intimate Partner Violence: Not on file    Current Outpatient Medications on File Prior to Visit  Medication Sig Dispense Refill   Apoaequorin (PREVAGEN PO) Take 2 tablets by mouth daily.     aspirin EC 81 MG tablet Take 81 mg by mouth 3 (three) times a week.     diclofenac Sodium (VOLTAREN) 1 % GEL      Glucosamine HCl-MSM (GLUCOSAMINE-MSM PO) Take 1 tablet by mouth as needed.      Melatonin 5 MG TABS Take 1 tablet by mouth at bedtime as needed.     methimazole (TAPAZOLE) 5 MG tablet Take 1 tablet (5 mg total) by mouth daily. 90 tablet 3   Multiple Vitamin (MULTIVITAMIN) tablet  Take 1 tablet by mouth daily.     TRAVATAN Z 0.004 % SOLN ophthalmic solution INSTILL 1 DROP INTO EACH  EYE ONCE DAILY IN THE EVENING  12   VYZULTA 0.024 % SOLN      No current facility-administered medications on file prior to visit.    No Known Allergies  Family History  Problem Relation Age of Onset   Alzheimer's disease Mother    Alzheimer's disease Father    Alzheimer's disease Sister    Breast cancer Sister    Parkinson's disease Brother    Depression Sister    Thyroid disease Neg Hx     BP 122/70 (BP Location: Right Arm, Patient Position: Sitting, Cuff Size: Normal)   Pulse 63   Ht 5\' 4"  (1.626 m)   Wt 170 lb (77.1 kg)   SpO2 96%   BMI 29.18 kg/m    Review of Systems Denies fever    Objective:   Physical Exam VITAL SIGNS:  See vs page GENERAL: no  distress NECK: thyroid is slightly enlarged on the right, and normal on the left.  No palpable lymphadenopathy at the anterior neck.    Lab Results  Component Value Date   TSH 2.63 12/25/2020       Assessment & Plan:  Hyperthyroidism: well-controlled.  Please continue the same tapazole.

## 2020-12-25 NOTE — Patient Instructions (Signed)
Blood tests are requested for you today.  We'll let you know about the results.  If ever you have fever while taking methimazole, stop it and call us, even if the reason is obvious, because of the risk of a rare side-effect.  It is best to never miss the medication.  However, if you do miss it, next best is to double up the next time.   Let's recheck the ultrasound.  you will receive a phone call, about a day and time for an appointment.   please come back for a follow-up appointment in 6 months.

## 2021-01-07 ENCOUNTER — Ambulatory Visit
Admission: RE | Admit: 2021-01-07 | Discharge: 2021-01-07 | Disposition: A | Discharge status: Home / Self Care | Payer: Medicare PPO | Source: Ambulatory Visit | Attending: Endocrinology | Admitting: Endocrinology

## 2021-01-07 DIAGNOSIS — E041 Nontoxic single thyroid nodule: Secondary | ICD-10-CM | POA: Diagnosis not present

## 2021-01-07 DIAGNOSIS — E042 Nontoxic multinodular goiter: Secondary | ICD-10-CM

## 2021-01-08 ENCOUNTER — Telehealth: Payer: Self-pay | Admitting: Endocrinology

## 2021-01-08 NOTE — Telephone Encounter (Signed)
Pt would like the results of most recent thyroid lab.  Pt contact [phone]

## 2021-01-08 NOTE — Telephone Encounter (Signed)
Lab results sent thru [facility]

## 2021-02-17 DIAGNOSIS — E785 Hyperlipidemia, unspecified: Secondary | ICD-10-CM | POA: Diagnosis not present

## 2021-02-17 DIAGNOSIS — Z79899 Other long term (current) drug therapy: Secondary | ICD-10-CM | POA: Diagnosis not present

## 2021-02-17 DIAGNOSIS — Z85831 Personal history of malignant neoplasm of soft tissue: Secondary | ICD-10-CM | POA: Diagnosis not present

## 2021-02-17 DIAGNOSIS — Z0000 Encounter for general adult medical examination without abnormal findings: Secondary | ICD-10-CM | POA: Diagnosis not present

## 2021-02-17 DIAGNOSIS — H409 Unspecified glaucoma: Secondary | ICD-10-CM | POA: Diagnosis not present

## 2021-02-17 DIAGNOSIS — D069 Carcinoma in situ of cervix, unspecified: Secondary | ICD-10-CM | POA: Diagnosis not present

## 2021-02-17 DIAGNOSIS — Z96649 Presence of unspecified artificial hip joint: Secondary | ICD-10-CM | POA: Diagnosis not present

## 2021-02-17 DIAGNOSIS — E559 Vitamin D deficiency, unspecified: Secondary | ICD-10-CM | POA: Diagnosis not present

## 2021-02-17 DIAGNOSIS — E042 Nontoxic multinodular goiter: Secondary | ICD-10-CM | POA: Diagnosis not present

## 2021-03-05 DIAGNOSIS — Z1231 Encounter for screening mammogram for malignant neoplasm of breast: Secondary | ICD-10-CM | POA: Diagnosis not present

## 2021-03-26 DIAGNOSIS — H401131 Primary open-angle glaucoma, bilateral, mild stage: Secondary | ICD-10-CM | POA: Diagnosis not present

## 2021-03-26 DIAGNOSIS — H43813 Vitreous degeneration, bilateral: Secondary | ICD-10-CM | POA: Diagnosis not present

## 2021-04-16 ENCOUNTER — Ambulatory Visit (INDEPENDENT_AMBULATORY_CARE_PROVIDER_SITE_OTHER): Payer: Medicare PPO

## 2021-04-16 ENCOUNTER — Other Ambulatory Visit: Payer: Self-pay | Admitting: Physician Assistant

## 2021-04-16 ENCOUNTER — Ambulatory Visit (INDEPENDENT_AMBULATORY_CARE_PROVIDER_SITE_OTHER): Payer: Medicare PPO | Admitting: Physician Assistant

## 2021-04-16 ENCOUNTER — Other Ambulatory Visit: Payer: Self-pay

## 2021-04-16 ENCOUNTER — Encounter: Payer: Self-pay | Admitting: Physician Assistant

## 2021-04-16 DIAGNOSIS — M5416 Radiculopathy, lumbar region: Secondary | ICD-10-CM

## 2021-04-16 DIAGNOSIS — M25551 Pain in right hip: Secondary | ICD-10-CM

## 2021-04-16 MED ORDER — METHYLPREDNISOLONE 4 MG PO TABS: ORAL_TABLET | ORAL | 0 refills | Status: DC

## 2021-04-16 MED ORDER — TIZANIDINE HCL 2 MG PO CAPS: 2.0000 mg | ORAL_CAPSULE | Freq: Three times a day (TID) | ORAL | 1 refills | Status: DC

## 2021-04-16 NOTE — Progress Notes (Signed)
Office Visit Note   Patient: Rachel Trevino           Date of Birth: September 04, 1950           MRN: [id number] Visit Date: 04/16/2021              Requested by: Jonathon Jordan, MD [address] [place],  [place] [address] PCP: Jonathon Jordan, MD   Assessment & Plan: Visit Diagnoses:  1. Pain in right hip   2. Lumbar radicular pain     Plan: Recommend formal physical therapy for core strengthening, stretching hamstrings, home exercise program with modalities.  See her back in 4 weeks see how she is doing overall.  She is placed on Medrol Dosepak and Zanaflex.  Questions were encouraged and answered at length.  No other NSAIDs while on the Medrol Dosepak was discussed with the patient.   Follow-Up Instructions: Return in about 4 weeks (around 05/14/2021).   Orders:  Orders Placed This Encounter  Procedures   XR Lumbar Spine 2-3 Views   XR HIP UNILAT W OR W/O PELVIS 2-3 VIEWS RIGHT   Meds ordered this encounter  Medications   methylPREDNISolone (MEDROL) 4 MG tablet    Sig: Take as directed    Dispense:  21 tablet    Refill:  0   tizanidine (ZANAFLEX) 2 MG capsule    Sig: Take 1 capsule (2 mg total) by mouth 3 (three) times daily.    Dispense:  40 capsule    Refill:  1      Procedures: No procedures performed   Clinical Data: No additional findings.   Subjective: Chief Complaint  Patient presents with   Lower Back - Pain   Right Hip - Pain    HPI [name] 71 year old female who is known to Dr. Delilah Shan service however have not seen her in some time.  History of right total hip arthroplasty 2017.  States right hip is overall doing well.  She is just having pain lateral aspect right hip that radiates down into her right foot she does note some numbness tingling down the lateral aspect of the leg but sometimes goes into the foot.  She has had no new injury.  Denies any groin pain.  She is using Advil without much relief.  Symptoms been ongoing for the  past 2 months.  No waking pain.  Review of Systems  Constitutional:  Negative for chills and fever.    Objective: Vital Signs: There were no vitals taken for this visit.  Physical Exam Constitutional:      Appearance: She is not ill-appearing or diaphoretic.  Cardiovascular:     Pulses: Normal pulses.  Pulmonary:     Effort: Pulmonary effort is normal.  Neurological:     Mental Status: She is alert and oriented to person, place, and time.  Psychiatric:        Mood and Affect: Mood normal.    Ortho Exam Lower extremities 5 and 5 strength throughout.  Tight hamstrings bilaterally.  Negative straight leg raise bilaterally.  Flexion limited to the lumbar spine coming within 6 inches of touching toes.  Limited extension of the lumbar spine with some discomfort.  Tenderness over the right lower lumbar paraspinous region.  Deep tendon reflexes are 2+ at the knees ankles equal and symmetric.  Sensation grossly intact bilateral feet to light touch. Specialty Comments:  No specialty comments available.  Imaging: XR HIP UNILAT W OR W/O PELVIS 2-3 VIEWS RIGHT  Result Date: 04/16/2021 AP pelvis lateral view of the right hip: Status post right total hip arthroplasty well-seated components.  No hardware failure.  Hips well located.  Left hip overall well-preserved.  XR Lumbar Spine 2-3 Views  Result Date: 04/16/2021 Lumbar spine 2 views: Loss of lordotic curvature.  Multiple levels with endplate spurring.  Slight anterior spinal listhesis L4 on 5.  No acute fractures or bony abnormalities otherwise.    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 denies long hospitilization with procedure   CERVICAL CONIZATION W/BX N/A 01/13/2018   Procedure: CONIZATION CERVIX;  Surgeon: Isabel Caprice, MD;  Location: [facility];  Service: Gynecology;  Laterality: N/A;   LASER ABLATION CONDOLAMATA N/A 01/13/2018   Procedure: LASER ABLATION  OF VAGINA;  Surgeon: Isabel Caprice, MD;  Location: [facility];  Service: Gynecology;  Laterality: N/A;  Need omin Laser for procedure. [name] spoke to [place] and I called office to let them that they needed to call [employer] to schedule rep to bring omni laser. Rep # [id number] or [phone] [name]. If unable to reach [name] may have a   left leg surgery for sarcoma  2012   and radiation   TONSILLECTOMY     TOTAL HIP ARTHROPLASTY Right 03/29/2015   Procedure: RIGHT TOTAL HIP ARTHROPLASTY ANTERIOR APPROACH;  Surgeon: Mcarthur Rossetti, MD;  Location: [facility];  Service: Orthopedics;  Laterality: Right;   TUBAL LIGATION     Social History   Occupational History   Not on file  Tobacco Use   Smoking status: Never   Smokeless tobacco: Never  Substance and Sexual Activity   Alcohol use: Yes    Comment: 1/week   Drug use: No   Sexual activity: Not on file

## 2021-04-16 NOTE — Addendum Note (Signed)
Addended by: Robyne Peers on: 04/16/2021 04:51 PM   Modules accepted: Orders

## 2021-04-22 ENCOUNTER — Other Ambulatory Visit: Payer: Self-pay

## 2021-04-22 MED ORDER — METHOCARBAMOL 500 MG PO TABS: 500.0000 mg | ORAL_TABLET | Freq: Three times a day (TID) | ORAL | 0 refills | Status: DC

## 2021-04-28 ENCOUNTER — Other Ambulatory Visit: Payer: Self-pay | Admitting: Endocrinology

## 2021-04-29 ENCOUNTER — Telehealth: Payer: Self-pay | Admitting: Physician Assistant

## 2021-04-29 ENCOUNTER — Other Ambulatory Visit: Payer: Self-pay | Admitting: Orthopaedic Surgery

## 2021-04-29 ENCOUNTER — Other Ambulatory Visit: Payer: Self-pay

## 2021-04-29 MED ORDER — METHOCARBAMOL 500 MG PO TABS: 500.0000 mg | ORAL_TABLET | Freq: Three times a day (TID) | ORAL | 0 refills | Status: DC

## 2021-04-29 MED ORDER — TIZANIDINE HCL 2 MG PO TABS: 2.0000 mg | ORAL_TABLET | Freq: Three times a day (TID) | ORAL | 0 refills | Status: DC | PRN

## 2021-04-29 NOTE — Telephone Encounter (Signed)
Please advise 

## 2021-04-29 NOTE — Telephone Encounter (Signed)
Pt called asking for refill on zanaflex and robaxin.   CB [phone]

## 2021-04-29 NOTE — Telephone Encounter (Signed)
I called and talked the pt. She would rather take the methocarbamol if she has to choose. I have resent this and called the pharmacy to cancel the other

## 2021-05-08 ENCOUNTER — Other Ambulatory Visit: Payer: Self-pay | Admitting: Orthopaedic Surgery

## 2021-05-14 ENCOUNTER — Telehealth: Payer: Self-pay | Admitting: Orthopaedic Surgery

## 2021-05-14 NOTE — Telephone Encounter (Signed)
Patient called advised the pharmacy advised her the Tizanidine capsules are $82.90 for 40 capsules. Patient said the pharmacist told her the tablets are so much less. Patient asked if a Rx for the Tablets can be called into the [facility] on [address]. The number to contact patient is [phone] ?

## 2021-05-14 NOTE — Telephone Encounter (Signed)
Called in tablets  ?

## 2021-05-16 ENCOUNTER — Telehealth: Payer: Self-pay | Admitting: Orthopaedic Surgery

## 2021-05-16 ENCOUNTER — Other Ambulatory Visit: Payer: Self-pay | Admitting: Orthopaedic Surgery

## 2021-05-16 NOTE — Telephone Encounter (Signed)
I called patient, voicemail full.  Looks like Rachel Trevino just called in Tizanidine tablets on 05/14/2021.  Holding for you all. ?

## 2021-05-16 NOTE — Therapy (Signed)
OUTPATIENT PHYSICAL THERAPY  EVALUATION   Patient Name: Rachel Trevino MRN: [id number] DOB:09-20-1950, 71 y.o., female Today's Date: 05/20/2021   PT End of Session - 05/20/21 0846     Visit Number 1    Number of Visits 20    Date for PT Re-Evaluation 07/29/21    Authorization Type [employer]    Authorization - Visit Number 1    PT Start Time 0848    PT Stop Time 0925    PT Time Calculation (min) 37 min    Activity Tolerance Patient tolerated treatment well    Behavior During Therapy [facility] for tasks assessed/performed             Past Medical History:  Diagnosis Date   Arthritis    oa right leg   Complication of anesthesia 2012   needed fiber optic,  scope for good vocal cord view, pt does not have letter from [facility]  on chart   Lymphedema 2012   left leg   Sarcoma of lower extremity, 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 denies long hospitilization with procedure   CERVICAL CONIZATION W/BX N/A 01/13/2018   Procedure: CONIZATION CERVIX;  Surgeon: Isabel Caprice, MD;  Location: [facility];  Service: Gynecology;  Laterality: N/A;   LASER ABLATION CONDOLAMATA N/A 01/13/2018   Procedure: LASER ABLATION  OF VAGINA;  Surgeon: Isabel Caprice, MD;  Location: [facility];  Service: Gynecology;  Laterality: N/A;  Need omin Laser for procedure. [name] spoke to [place] and I called office to let them that they needed to call [employer] to schedule rep to bring omni laser. Rep # [phone] or [phone] [name]. If unable to reach 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 2 months)  SUBJECTIVE:   SUBJECTIVE STATEMENT: Pt indicated having complaints noted in toes with tingling and noticed pain in lower Rt lateral leg and into Rt hip.  Occasional back but not as much as the leg complaints.  Pt described cramping feeling as well at times.  Pt indicated sleeping with occasional trouble.  No bowel or bladder changes.  PERTINENT HISTORY: History of Lt LE sarcoma 2012, Rt THA 2017.  2 months onset insidious  PAIN:  Are you having pain? Yes NPRS scale: rated moderate (4-6) range Pain location: Rt leg, occasional back PAIN TYPE: acute Pain description: cramping, tingling Aggravating factors: walking, random cramping, prolonged sitting  Relieving factors: medicine  PRECAUTIONS: None  WEIGHT BEARING RESTRICTIONS No  FALLS:  Has patient fallen in last 6 months? No, Number of falls: 0   LIVING ENVIRONMENT: Lives with: lives with their family and lives alone Lives in: House/apartment Stairs: No;    OCCUPATION: Retired  PLOF: Independent, housework, taking care of grandson, walking for exercise (limited at this time)  PATIENT GOALS  Reduce pain   OBJECTIVE:   PATIENT SURVEYS:  05/20/2021: FOTO - not collected today, plan to collect visit 2  COGNITION:  05/20/2021: Overall cognitive status: Within functional limits for tasks  assessed     SENSATION:  05/20/2021: Light touch: Appears intact    MUSCLE LENGTH: 05/20/2021: passive SLR Rt 70 deg, Lt 80 deg  POSTURE:  05/20/2021: no lateral shift noted in standing  PALPATION: 05/20/2021:  No specific tenderness noted in lumbar region today upon palpation.    Lumbar AROM:    AROM  05/20/2021   Flexion Movement to ankles no change in symptoms in Rt leg   Extension 50% WFL, repeated in standing x 10 improved to 75% [facility] c centralization to upper thigh (resolved lower leg symptoms)   Lt lateral flexion    Rt lateral flexion               LE AROM/PROM:  A/PROM Right 05/20/2021 Left 05/20/2021  Hip flexion    Hip extension    Hip abduction    Hip adduction    Hip internal rotation    Hip external rotation    Knee flexion    Knee extension    Ankle dorsiflexion    Ankle plantarflexion    Ankle inversion    Ankle eversion     (Blank rows = not tested)  LE MMT:  MMT Right 05/20/2021 Left 05/20/2021  Hip flexion 4/5 5/5  Hip extension    Hip abduction    Hip adduction    Hip internal rotation    Hip external rotation    Knee flexion 5/5 5/5  Knee extension 5/5 5/5  Ankle dorsiflexion 5/5 5/5  Ankle plantarflexion    Ankle inversion    Ankle eversion     (Blank rows = not tested)  LOWER EXTREMITY SPECIAL TESTS:  05/20/2021: + slump on Rt,  (-) crossed SLR bilateral  FUNCTIONAL TESTS:  05/20/2021: 18 inch chair transfer s UE on 1st try   TODAY'S TREATMENT: 05/20/2021: Therex:  HEP instruction/performance c cues for techniques, handout provided.  Trial set performed of each for comprehension and symptom assessment.  See below for exercise list.  Additional time spent in education of directional preference for centralization in extension as noted in evaluation today.    PATIENT EDUCATION:  05/20/2021: Education details: HEP, POC Person educated: Patient Education method: Consulting civil engineer, Media planner, Verbal cues, and Handouts Education comprehension:  verbalized understanding and returned demonstration   HOME EXERCISE PROGRAM: 05/20/2021: Access Code: [email or web address] URL: [email or web address] Date: 05/20/2021 Prepared by: Scot Jun  Exercises Supine Piriformis Stretch with Foot on Ground - 2-3 x daily - 7 x weekly - 1 sets - 5 reps - 30 hold Supine 90/90 Sciatic Nerve Glide with Knee Flexion/Extension - 2-3 x daily - 7 x weekly - 3 sets - 10 reps ( or sitting) Standing Lumbar Extension with Counter - 3-5 x daily - 7 x weekly - 1 sets - 5-10 reps Prone Press Up - 3-5 x daily - 7 x weekly - 1 sets - 5-10 reps - 1-2 hold   ASSESSMENT:  CLINICAL IMPRESSION: Patient is a 71 y.o. who comes to clinic with complaints of low back pain with mobility, strength and movement coordination deficits that impair their ability to perform usual daily and recreational functional activities without increase difficulty/symptoms at this time.  Patient to benefit from skilled PT services to address impairments and limitations to improve to previous level of function without restriction secondary to condition.    OBJECTIVE IMPAIRMENTS Abnormal gait, decreased activity tolerance, decreased balance, decreased coordination, decreased endurance, decreased mobility, difficulty walking, decreased ROM, decreased strength, hypomobility, impaired perceived functional ability, increased muscle spasms, impaired flexibility, improper body mechanics, postural dysfunction, and pain.   ACTIVITY LIMITATIONS cleaning, community activity, meal prep, and shopping.   PERSONAL FACTORS  History of Lt LE sarcoma 2012, Rt THA 2017  are also affecting patient's functional outcome.    REHAB POTENTIAL: Good  CLINICAL DECISION MAKING: Stable/uncomplicated  EVALUATION COMPLEXITY: Low   GOALS: Goals reviewed with patient? Yes Eval Date:  06/09/2021: Short term PT Goals (target date for Short term goals are 3 weeks 06/10/2021) Patient will demonstrate independent use  of home exercise program to maintain progress from in clinic treatments. Goal status: New   Long term PT goals (target dates for all long term goals are 10 weeks  07/29/2021 ) Patient will demonstrate/report pain at worst less than or equal to 2/10 to facilitate minimal limitation in daily activity secondary to pain symptoms. Goal status: New  Patient will demonstrate independent use of home exercise program to facilitate ability to maintain/progress functional gains from skilled physical therapy services. Goal status: New  Patient will demonstrate FOTO outcome > or = (insert data from collection on visit 2) % to indicate reduced disability due to condition. Goal status: New  Patient will demonstrate lumbar extension 100 % WFL s symptoms to facilitate upright standing, walking posture at PLOF s limitation. Goal status: New      5.  Patient will demonstrate bilateral LE MMT 5/5 throughout to facilitate usual lifting, carrying in functional activity to PLOF s limitation.  Goal status: New      6.  Patient will demonstrate/report ability to walk for exercise s limitation due to symptoms.   Goal status: New    PLAN: PT FREQUENCY: 1-2x/week  PT DURATION: 10 weeks  PLANNED INTERVENTIONS:  Therapeutic exercises, Therapeutic activity, Neuro Muscular re-education, Balance training, Gait training, Patient/Family education, Joint mobilization, Stair training, DME instructions, Dry Needling, Electrical stimulation, Cryotherapy, Moist heat, Taping, Ultrasound, Ionotophoresis 4mg /ml Dexamethasone, and Manual therapy.  All included unless contraindicated  PLAN FOR NEXT SESSION: Review knowledge of HEP, review centralization response to extension.   COLLECT FOTO   Scot Jun, PT, DPT, OCS, ATC 05/20/21  9:51 AM  Referring diagnosis? M54.16 (ICD-10-CM) - Lumbar radicular pain Treatment diagnosis? (if different than referring diagnosis) M54.41 What was this (referring dx) caused by? []   Surgery []  Fall [x]  Ongoing issue []  Arthritis []  Other: ____________  Laterality: []  Rt []  Lt [x]  Both  Check all possible CPT codes:  *CHOOSE 10 OR LESS*    [x]  97110 (Therapeutic Exercise)  []  92507 (SLP Treatment)  [x]  97112 (Neuro Re-ed)   []  92526 (Swallowing Treatment)   [x]  97116 (Gait Training)   []  [id number] (Cognitive Training, 1st 15 minutes) [x]  48546 (Manual Therapy)   []  97130 (Cognitive Training, each add'l 15 minutes)  [x]  97530 (Therapeutic Activities)  []  Other, List CPT Code ____________    [x]  27035 (Self Care)       []  All codes above (  97110 - 97535)  [x]  [id number] (Mechanical Traction)  [x]  97014 (E-stim Unattended)  []  97032 (E-stim manual)  []  97033 (Ionto)  []  97035 (Ultrasound)  []  97760 (Orthotic Fit) [x]  [id number] (Physical Performance Training) []  [id number] (Aquatic Therapy) []  97034 (Contrast Bath) []  [id number] (Paraffin) []  97597 (Wound Care 1st 20 sq cm) []  97598 (Wound Care each add'l 20 sq cm) []  97016 (Vasopneumatic Device) []  [phone] Comptroller) []  [phone] (Prosthetic Training)

## 2021-05-16 NOTE — Telephone Encounter (Signed)
Pt called please resend script of methocarbamol to pharmacy. Please call pt at [phone] ?

## 2021-05-20 ENCOUNTER — Ambulatory Visit: Payer: Medicare PPO | Admitting: Rehabilitative and Restorative Service Providers"

## 2021-05-20 ENCOUNTER — Encounter: Payer: Self-pay | Admitting: Rehabilitative and Restorative Service Providers"

## 2021-05-20 ENCOUNTER — Other Ambulatory Visit: Payer: Self-pay

## 2021-05-20 DIAGNOSIS — M79604 Pain in right leg: Secondary | ICD-10-CM

## 2021-05-20 DIAGNOSIS — M6281 Muscle weakness (generalized): Secondary | ICD-10-CM | POA: Diagnosis not present

## 2021-05-20 DIAGNOSIS — R262 Difficulty in walking, not elsewhere classified: Secondary | ICD-10-CM | POA: Diagnosis not present

## 2021-05-20 DIAGNOSIS — M5441 Lumbago with sciatica, right side: Secondary | ICD-10-CM | POA: Diagnosis not present

## 2021-05-21 ENCOUNTER — Other Ambulatory Visit: Payer: Self-pay | Admitting: Orthopaedic Surgery

## 2021-05-22 ENCOUNTER — Telehealth: Payer: Self-pay | Admitting: Orthopaedic Surgery

## 2021-05-22 NOTE — Telephone Encounter (Signed)
Pt called requesting a refill of tizandine. Please send to pharmacy on file. Pt phone number is [phone]. ?

## 2021-05-23 ENCOUNTER — Other Ambulatory Visit: Payer: Self-pay

## 2021-05-23 MED ORDER — TIZANIDINE HCL 2 MG PO TABS: 2.0000 mg | ORAL_TABLET | Freq: Three times a day (TID) | ORAL | 0 refills | Status: DC | PRN

## 2021-05-23 NOTE — Telephone Encounter (Signed)
Sent into her pharmacy

## 2021-05-25 ENCOUNTER — Encounter (HOSPITAL_BASED_OUTPATIENT_CLINIC_OR_DEPARTMENT_OTHER): Payer: Self-pay | Admitting: Obstetrics and Gynecology

## 2021-05-25 ENCOUNTER — Other Ambulatory Visit: Payer: Self-pay

## 2021-05-25 ENCOUNTER — Emergency Department (HOSPITAL_BASED_OUTPATIENT_CLINIC_OR_DEPARTMENT_OTHER)
Admission: EM | Admit: 2021-05-25 | Discharge: 2021-05-25 | Disposition: A | Discharge status: Home / Self Care | Payer: Medicare PPO | Attending: Emergency Medicine | Admitting: Emergency Medicine

## 2021-05-25 DIAGNOSIS — M5441 Lumbago with sciatica, right side: Secondary | ICD-10-CM | POA: Diagnosis not present

## 2021-05-25 DIAGNOSIS — M79604 Pain in right leg: Secondary | ICD-10-CM | POA: Diagnosis not present

## 2021-05-25 DIAGNOSIS — M5431 Sciatica, right side: Secondary | ICD-10-CM | POA: Diagnosis not present

## 2021-05-25 MED ORDER — HYDROCODONE-ACETAMINOPHEN 5-325 MG PO TABS: 1.0000 | ORAL_TABLET | Freq: Four times a day (QID) | ORAL | 0 refills | Status: DC | PRN

## 2021-05-25 MED ORDER — PREDNISONE 20 MG PO TABS: ORAL_TABLET | ORAL | 0 refills | Status: DC

## 2021-05-25 MED ORDER — METHOCARBAMOL 500 MG PO TABS: 500.0000 mg | ORAL_TABLET | Freq: Four times a day (QID) | ORAL | 0 refills | Status: DC | PRN

## 2021-05-25 MED ORDER — OXYCODONE-ACETAMINOPHEN 5-325 MG PO TABS: 1.0000 | ORAL_TABLET | Freq: Three times a day (TID) | ORAL | 0 refills | Status: DC | PRN

## 2021-05-25 MED ORDER — KETOROLAC TROMETHAMINE 15 MG/ML IJ SOLN
15.0000 mg | Freq: Once | INTRAMUSCULAR | Status: AC
  Administered 2021-05-25: 15 mg via INTRAMUSCULAR
  Filled 2021-05-25: qty 1

## 2021-05-25 MED ORDER — DEXAMETHASONE SODIUM PHOSPHATE 10 MG/ML IJ SOLN
10.0000 mg | Freq: Once | INTRAMUSCULAR | Status: AC
  Administered 2021-05-25: 10 mg via INTRAMUSCULAR
  Filled 2021-05-25: qty 1

## 2021-05-25 MED ORDER — HYDROMORPHONE HCL 1 MG/ML IJ SOLN
1.0000 mg | Freq: Once | INTRAMUSCULAR | Status: AC
  Administered 2021-05-25: 1 mg via INTRAMUSCULAR
  Filled 2021-05-25: qty 1

## 2021-05-25 NOTE — ED Triage Notes (Signed)
Patient reports she has spasms in her right leg. Patient reports this has never happened before. Reports it runs from the hip down to the ankle on the right leg.  ?

## 2021-05-25 NOTE — ED Notes (Addendum)
Pt verbalizes understanding of discharge instructions. Opportunity for questioning and answers were provided. Pt discharged from ED to home with son.    

## 2021-05-25 NOTE — Discharge Instructions (Signed)
1.  Your symptoms are very suggestive of sciatica.  Sciatica can have several causes in the back buttocks or leg.  Follow-up with your doctor for recheck in the next 5 to 7 days. ?2.  You have been treated with a shot of steroids in the emergency department.  Start your prednisone as prescribed 3 days after your emergency department visit.  You may take 1 hydrocodone tablet every 6 hours for pain.  If you tend to get constipated, take a stool softener twice a day.  Hydrocodone can cause constipation.  You may use the muscle relaxer Robaxin as prescribed. ?3.  If your symptoms are persisting and worsening, you may need an MRI for further evaluation.  Discuss this with your doctor. ?4.  Return to emergency department if you get weakness, loss of control of your bladder or other sudden or worsening concerning symptoms ? ?

## 2021-05-25 NOTE — ED Provider Notes (Signed)
?[facility] EMERGENCY DEPT ?Provider Note ? ? ?CSN: [id number] ?Arrival date & time: 05/25/21  1744 ? ?  ? ?History ? ?Chief Complaint  ?Patient presents with  ? Leg Pain  ? ? ?Rachel Trevino is a 71 y.o. female. ? ?HPI ?Patient reports has been having problems with pain down the back of her right leg and the bottom of her foot for about 2-1/2 months now.  She reports she has been told it is sciatica and has had several types of treatment with medications.  Nonetheless the symptoms persist and continue to give her a lot of trouble.  She denies she experiences a lot of pain in her back.  She reports most of the pain is in the back of her leg, she indicates her hamstring the back of her calf and the sole of her foot.  She reports sometimes a pain is sharp and other times burning quality.  Is often worse after sitting.  She reports she has to stretch out and get moving again sometimes after she is been sitting for a while.  There are also certain positions that make it worse and make it shoot into her foot more intensely.  She has not had any bowel or bladder problems.  She has not had any abdominal pain.  No weakness limiting her ability to walk.  Sometimes ability to walk is pain limited.  She did have some prednisone several months ago.  She did not notice much improvement.  She reports she gets some improvement from Robaxin that is prescribed. ?  ? ?Home Medications ?Prior to Admission medications   ?Medication Sig Start Date End Date Taking? Authorizing Provider  ?Apoaequorin (PREVAGEN PO) Take 2 tablets by mouth daily.   Yes [provider]  ?diclofenac Sodium (VOLTAREN) 1 % GEL  10/12/19  Yes [provider]  ?Glucosamine HCl-MSM (GLUCOSAMINE-MSM PO) Take 1 tablet by mouth as needed.    Yes [provider]  ?Melatonin 5 MG TABS Take 1 tablet by mouth at bedtime as needed.   Yes [provider]  ?methimazole (TAPAZOLE) 5 MG tablet TAKE 1 TABLET (5 MG TOTAL) BY MOUTH  DAILY. 04/29/21  Yes Renato Shin, MD  ?methocarbamol (ROBAXIN) 500 MG tablet TAKE 1 TABLET BY MOUTH THREE TIMES A DAY 05/16/21  Yes Mcarthur Rossetti, MD  ?methocarbamol (ROBAXIN) 500 MG tablet Take 1 tablet (500 mg total) by mouth every 6 (six) hours as needed for muscle spasms. 05/25/21  Yes Charlesetta Shanks, MD  ?methylPREDNISolone (MEDROL) 4 MG tablet Take as directed 04/16/21  Yes Pete Pelt, PA-C  ?Multiple Vitamin (MULTIVITAMIN) tablet Take 1 tablet by mouth daily.   Yes [provider]  ?oxyCODONE-acetaminophen (PERCOCET) 5-325 MG tablet Take 1 tablet by mouth every 8 (eight) hours as needed. 05/25/21  Yes Charlesetta Shanks, MD  ?predniSONE (DELTASONE) 20 MG tablet 2 tabs po daily x 3 days 05/25/21  Yes Rayssa Atha, Jeannie Done, MD  ?tiZANidine (ZANAFLEX) 2 MG tablet Take 1 tablet (2 mg total) by mouth every 8 (eight) hours as needed for muscle spasms. 05/23/21  Yes Mcarthur Rossetti, MD  ?TRAVATAN Z 0.004 % SOLN ophthalmic solution INSTILL 1 DROP INTO EACH  EYE ONCE DAILY IN THE EVENING 02/12/15  Yes [provider]  ?aspirin EC 81 MG tablet Take 81 mg by mouth 3 (three) times a week.    [provider]  ?VYZULTA 0.024 % SOLN  10/06/19   [provider]  ?   ? ?Allergies    ?  Patient has no known allergies.   ? ?Review of Systems   ?Review of Systems ?10 systems reviewed negative except as per HPI ?Physical Exam ?Updated Vital Signs ?BP 131/75   Pulse (!) 58   Temp 97.8 ?F (36.6 ?C)   Resp 18   SpO2 96%  ?Physical Exam ?Constitutional:   ?   Comments: Alert nontoxic clinically well in appearance.  No respiratory distress  ?HENT:  ?   Head: Normocephalic and atraumatic.  ?Eyes:  ?   Extraocular Movements: Extraocular movements intact.  ?Cardiovascular:  ?   Rate and Rhythm: Normal rate and regular rhythm.  ?Pulmonary:  ?   Effort: Pulmonary effort is normal.  ?   Breath sounds: Normal breath sounds.  ?Abdominal:  ?   General: There is no distension.  ?   Palpations:  Abdomen is soft.  ?   Tenderness: There is no abdominal tenderness. There is no guarding.  ?Musculoskeletal:  ?   Comments: Palpation of the back and buttock does not reproduce pain.  No reproducible pain in the soft tissues of the posterior leg or calf.  Palpation of the soft tissues of the entirety of the back and legs is normal.  Patient has no edema.  Both feet are warm and dry.  Skin condition is good.  Patient has 2+ pedal pulses.  Pain is reproduced by having the patient placed her left foot flat on the bed and then elevate her hip.  This causes pain to radiate down the back of her leg.  That pain concentrates most in her hamstring.  Strength testing of both lower extremities is 5\5.  ?Neurological:  ?   General: No focal deficit present.  ?   Mental Status: She is oriented to person, place, and time.  ?   Sensory: No sensory deficit.  ?   Motor: No weakness.  ?   Coordination: Coordination normal.  ?Psychiatric:     ?   Mood and Affect: Mood normal.  ? ? ?ED Results / Procedures / Treatments   ?Labs ?(all labs ordered are listed, but only abnormal results are displayed) ?Labs Reviewed - No data to display ? ?EKG ?None ? ?Radiology ?No results found. ? ?Procedures ?Procedures  ? ? ?Medications Ordered in ED ?Medications  ?dexamethasone (DECADRON) injection 10 mg (10 mg Intramuscular Given 05/25/21 2047)  ?HYDROmorphone (DILAUDID) injection 1 mg (1 mg Intramuscular Given 05/25/21 2048)  ?ketorolac (TORADOL) 15 MG/ML injection 15 mg (15 mg Intramuscular Given 05/25/21 2044)  ? ? ?ED Course/ Medical Decision Making/ A&P ?  ?                        ?Medical Decision Making ? ?Patient presents with symptoms consistent with sciatica.  She has been treated previously but not getting relief.  Patient is neurovascularly intact.  No motor weakness.  At this time I do not think patient needs any diagnostic imaging.  No signs of DVT or intra-abdominal source. ? ?Patient has not had any steroid therapy for almost a month and  a half.  We will give an IM Decadron shot and have her start prednisone in 3 days for an additional 3-day burst.  For acute pain patient can take 1 Percocet tablet every 8 hours.  (Discharge instructions contain reference to Vicodin however this is on backorder at [facility] and thus was changed to Percocet).  Patient reported improvement in tolerance for Robaxin.  Will represcribe Robaxin.  Patient counseled on  necessity to follow-up and possible indication for MRI if symptoms or not improving and she is in persistent pain or develops any worsening symptoms.  Return precautions were reviewed and included in discharge instructions ? ? ? ? ? ? ? ?Final Clinical Impression(s) / ED Diagnoses ?Final diagnoses:  ?Sciatica of right side  ? ? ?Rx / DC Orders ?ED Discharge Orders   ? ?      Ordered  ?  methocarbamol (ROBAXIN) 500 MG tablet  Every 6 hours PRN       ? 05/25/21 2134  ?  HYDROcodone-acetaminophen (NORCO/VICODIN) 5-325 MG tablet  Every 6 hours PRN,   Status:  Discontinued       ? 05/25/21 2134  ?  predniSONE (DELTASONE) 20 MG tablet       ?Note to Pharmacy: To start on 3\16.  ? 05/25/21 2134  ?  oxyCODONE-acetaminophen (PERCOCET) 5-325 MG tablet  Every 8 hours PRN       ? 05/25/21 2145  ? ?  ?  ? ?  ? ? ?  ?Charlesetta Shanks, MD ?05/25/21 2149 ? ?

## 2021-05-28 ENCOUNTER — Other Ambulatory Visit: Payer: Self-pay

## 2021-05-28 ENCOUNTER — Ambulatory Visit (INDEPENDENT_AMBULATORY_CARE_PROVIDER_SITE_OTHER): Payer: Medicare PPO | Admitting: Rehabilitative and Restorative Service Providers"

## 2021-05-28 ENCOUNTER — Encounter: Payer: Self-pay | Admitting: Rehabilitative and Restorative Service Providers"

## 2021-05-28 DIAGNOSIS — R262 Difficulty in walking, not elsewhere classified: Secondary | ICD-10-CM

## 2021-05-28 DIAGNOSIS — M79604 Pain in right leg: Secondary | ICD-10-CM | POA: Diagnosis not present

## 2021-05-28 DIAGNOSIS — M6281 Muscle weakness (generalized): Secondary | ICD-10-CM | POA: Diagnosis not present

## 2021-05-28 DIAGNOSIS — M5441 Lumbago with sciatica, right side: Secondary | ICD-10-CM | POA: Diagnosis not present

## 2021-05-28 NOTE — Therapy (Addendum)
?OUTPATIENT PHYSICAL THERAPY TREATMENT ? ? ?Patient Name: Rachel Trevino ?MRN: [id number] ?DOB:[birth year], 71 y.o., female ?Today's Date: 05/28/2021 ? ? PT End of Session - 05/28/21 1232   ? ? Visit Number 2   ? Number of Visits 20   ? Date for PT Re-Evaluation 07/29/21   ? Authorization Type [employer]   ? Authorization - Visit Number 2   ? PT Start Time 1146   ? PT Stop Time 1226   ? PT Time Calculation (min) 40 min   ? Activity Tolerance Patient limited by pain   ? Behavior During Therapy [facility] for tasks assessed/performed   ? ?  ?  ? ?  ? ? ? ?Past Medical History:  ?Diagnosis Date  ? Arthritis   ? oa right leg  ? Complication of anesthesia 2012  ? needed fiber optic,  scope for good vocal cord view, pt does not have letter from [facility]  on chart  ? Lymphedema 2012  ? left leg  ? Sarcoma of lower extremity, left ([place]) 2012  ? s/p surgery and radiation  ? ?Past Surgical History:  ?Procedure Laterality Date  ? APPENDECTOMY    ? open. denies long hospitilization with procedure  ? CERVICAL CONIZATION W/BX N/A 01/13/2018  ? Procedure: CONIZATION CERVIX;  Surgeon: Isabel Caprice, MD;  Location: [facility];  Service: Gynecology;  Laterality: N/A;  ? LASER ABLATION CONDOLAMATA N/A 01/13/2018  ? Procedure: LASER ABLATION  OF VAGINA;  Surgeon: Isabel Caprice, MD;  Location: [facility];  Service: Gynecology;  Laterality: N/A;  Need omin Laser for procedure. [name] spoke to [place] and I called office to let them that they needed to call [employer] to schedule rep to bring omni laser. Rep # [phone] or [phone] [name]. If unable to reach [name] may have a  ? left leg surgery for sarcoma  2012  ? and radiation  ? TONSILLECTOMY    ? TOTAL HIP ARTHROPLASTY Right 03/29/2015  ? Procedure: RIGHT TOTAL HIP ARTHROPLASTY ANTERIOR APPROACH;  Surgeon: Mcarthur Rossetti, MD;  Location: [facility];  Service: Orthopedics;  Laterality: Right;  ? TUBAL LIGATION    ? ?Patient Active Problem  List  ? Diagnosis Date Noted  ? Vaginal dysplasia   ? 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 Multinodular goiter 01/27/2017  ? Hyperthyroidism 01/27/2017  ? 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 Sarcoma ([place]) 01/05/2011  ? ? ?PCP: Jonathon Jordan, MD ? ?REFERRING PROVIDER: Pete Pelt, PA-C ? ?REFERRING DIAG: M54.16 (ICD-10-CM) - Lumbar radicular pain ? ?PERTINENT HISTORY: ?History of Lt LE sarcoma 2012, Rt THA 2017.  2 months onset insidious ? ?THERAPY DIAG:  ?Acute right-sided low back pain with right-sided sciatica ? ?Pain in right leg ? ?Muscle weakness (generalized) ? ?Difficulty in walking, not elsewhere classified ? ?ONSET DATE: 03/16/2021 (approx. 2 months) ? ?SUBJECTIVE:  ? ?SUBJECTIVE STATEMENT: ?Pt arrived today c complaints of tingling throughout Rt leg from hip down to foot.  Pt indicated the first few days were doing ok with HEP.  Pt indicated having pain increase on 05/25/2021 that led to ED visit.  Injections were given with improvement noted that night.  Pt. Indicated symptoms returned the next days.  ?Family member present during visit and reported some of the subjective information.  ? ?PAIN:  ?Are you having pain? Yes ?NPRS scale: severe pain "awful" ?Pain location: Rt leg, occasional  back ?PAIN TYPE: acute ?Pain description: cramping, tingling ?Aggravating factors: walking, random cramping, prolonged sitting  ?Relieving factors: medicine ? ?LIVING ENVIRONMENT: ?Lives with: lives with their family and lives alone ?Lives in: House/apartment ?Stairs: No;  ? ? ?OCCUPATION: Retired ? ?PLOF: Independent, housework, taking care of grandson, walking for exercise (limited at this time) ? ?PATIENT GOALS  Reduce pain ? ? ?OBJECTIVE:  ? ?PATIENT SURVEYS:  ?05/20/2021: no foto gathered  ? ?COGNITION: ? 05/20/2021: Overall cognitive status: Within functional limits for tasks assessed   ?  ?SENSATION: ? 05/20/2021: Light touch: Appears  intact ?  ? ?MUSCLE LENGTH: ?05/20/2021: passive SLR Rt 70 deg, Lt 80 deg ? ?POSTURE:  ?05/20/2021: no lateral shift noted in standing ? ?PALPATION: ?05/20/2021:  No specific tenderness noted in lumbar region today upon palpation.  ? ? Lumbar AROM:  ?  ?AROM  ?05/20/2021 05/28/2021  ?Flexion Movement to ankles no change in symptoms in Rt leg   ?Extension 50% WFL, repeated in standing x 10 improved to 75% WFL c centralization to upper thigh (resolved lower leg symptoms) Movement to neutral in standing c ERP lumbar and worsened Rt leg symptoms. ? ?Extension in prone press up on elbows worsened Rt leg symptoms   ?Lt lateral flexion    ?Rt lateral flexion    ?    ?    ?  ? 05/28/2021:  supine DKC reduced severity of symptoms mildly compared to performing.  ? ? ?LE AROM/PROM: ? ?A/PROM Right ?05/28/2021 Left ?05/28/2021  ?Hip flexion    ?Hip extension    ?Hip abduction    ?Hip adduction    ?Hip internal rotation    ?Hip external rotation    ?Knee flexion    ?Knee extension    ?Ankle dorsiflexion    ?Ankle plantarflexion    ?Ankle inversion    ?Ankle eversion    ? (Blank rows = not tested) ? ?LE MMT: ? ?MMT Right ?05/28/2021 Left ?05/28/2021  ?Hip flexion 4/5 5/5  ?Hip extension    ?Hip abduction    ?Hip adduction    ?Hip internal rotation    ?Hip external rotation    ?Knee flexion 5/5 5/5  ?Knee extension 5/5 5/5  ?Ankle dorsiflexion 5/5 5/5  ?Ankle plantarflexion    ?Ankle inversion    ?Ankle eversion    ? (Blank rows = not tested) ? ?LOWER EXTREMITY SPECIAL TESTS:  ?05/20/2021: + slump on Rt,  (-) crossed SLR bilateral ? ?FUNCTIONAL TESTS:  ?05/20/2021: 18 inch chair transfer s UE on 1st try ? ? ?TODAY'S TREATMENT: ?05/28/2021: ?Therex:  HEP review c persistent cues tactile and verbal to ensure knowledge and procedures. ?  Prone press up on elbow 5 sec hold x 10 ?  Supine DKC x 10 (2-3 sec hold) ?  Supine glute set 5 sec hold x 10 ?  Supine Rt piriformis figure 4 stretch into flexion 30 sec x 3 ?  Supine sciatic nerve flossing x 20 on Rt  (additional time c cues) ? ?05/20/2021: ?Therex:  HEP instruction/performance c cues for techniques, handout provided.  Trial set performed of each for comprehension and symptom assessment.  See below for exercise list.  Additional time spent in education of directional preference for centralization in extension as noted in evaluation today.  ? ? ?PATIENT EDUCATION:  ?05/20/2021: ?Education details: HEP, POC ?Person educated: Patient ?Education method: Explanation, Demonstration, Verbal cues, and Handouts ?Education comprehension: verbalized understanding and returned demonstration ? ? ?HOME EXERCISE PROGRAM: ?05/28/2021: ?Access Code: [email or web address] ?  URL: [email or web address] ?Date: 05/28/2021 ?Prepared by: Scot Jun ? ?Exercises ?Supine Piriformis Stretch with Foot on Ground - 2-3 x daily - 7 x weekly - 1 sets - 5 reps - 30 hold ?Supine 90/90 Sciatic Nerve Glide with Knee Flexion/Extension - 2-3 x daily - 7 x weekly - 3 sets - 10 reps ?Prone Press Up On Elbows - 2-3 x daily - 7 x weekly - 3 sets - 10 reps ?Supine Double Knee to Chest - 2-3 x daily - 7 x weekly - 1 sets - 10 reps - 5 hold ?Hooklying Gluteal Sets - 2-3 x daily - 7 x weekly - 1 sets - 10 reps - 5 hold ? ? ? ?ASSESSMENT: ? ?CLINICAL IMPRESSION: ?Pt presented c high severity and irritability of symptoms over the last few days (included ED visit).  Symptoms were in similar presentation as noted on evaluation but worsened as noted.  Reassessment of movement today produced mild reduction of symptoms c use of DKC, supine hip/neuro mobility intervention.  Previous possible benefit from prone extension movement on first visit was not present today due to irritability of symptoms.  Review of HEP and cues throughout visit today to ensure good techniques and detail importance of movement and use at home for symptom management.  ? ? ?OBJECTIVE IMPAIRMENTS Abnormal gait, decreased activity tolerance, decreased balance, decreased coordination, decreased  endurance, decreased mobility, difficulty walking, decreased ROM, decreased strength, hypomobility, impaired perceived functional ability, increased muscle spasms, impaired flexibility, improper body mechanics, postur

## 2021-06-04 ENCOUNTER — Ambulatory Visit: Payer: Medicare PPO | Admitting: Physician Assistant

## 2021-06-04 ENCOUNTER — Encounter: Payer: Self-pay | Admitting: Physician Assistant

## 2021-06-04 DIAGNOSIS — M5416 Radiculopathy, lumbar region: Secondary | ICD-10-CM | POA: Diagnosis not present

## 2021-06-04 MED ORDER — METHOCARBAMOL 500 MG PO TABS: 500.0000 mg | ORAL_TABLET | Freq: Four times a day (QID) | ORAL | 1 refills | Status: DC

## 2021-06-04 NOTE — Progress Notes (Signed)
HPI: Ms. Hardiman returns today for right leg pain.  She states her back pain overall is getting worse.  She does feel like therapy and a home exercise program helps some though.  She rates her pain to be 7 out of 10 pain at worst.  She denies any bowel or bladder dysfunction, saddle anesthesia or waking pain.  She notes that the pain radiates from her right buttocks down to her right foot.  She was seen in the ER due to low back pain and radicular symptoms down the right leg.  She continues to take Robaxin for the pain.  She was also given some oxycodone. ? ?Review of systems see HPI otherwise negative ? ? ?Physical exam: ?General: Well-developed well-nourished female who ambulates without any assistive device.  She appears uncomfortable and chooses to stand throughout most of the encounter today. ? ?Lower extremities: 5 out of 5 strength throughout.  Tight hamstrings bilaterally.  Negative straight leg raise bilaterally. ? ?Impression: ?Lumbar radicular pain ? ?Plan: Given the fact that she has tried therapy medications and continues to have low back pain with radiation down the right leg recommend MRI to rule out HNP as a source of her radicular pain.  Questions were encouraged and answered at length.  Refill on Robaxin was given today. ?

## 2021-06-05 NOTE — Addendum Note (Signed)
Addended by: Robyne Peers on: 06/05/2021 10:52 AM ? ? Modules accepted: Orders ? ?

## 2021-06-09 ENCOUNTER — Encounter: Payer: Self-pay | Admitting: Rehabilitative and Restorative Service Providers"

## 2021-06-09 ENCOUNTER — Other Ambulatory Visit: Payer: Self-pay

## 2021-06-09 ENCOUNTER — Ambulatory Visit (INDEPENDENT_AMBULATORY_CARE_PROVIDER_SITE_OTHER): Payer: Medicare PPO | Admitting: Rehabilitative and Restorative Service Providers"

## 2021-06-09 DIAGNOSIS — M6281 Muscle weakness (generalized): Secondary | ICD-10-CM

## 2021-06-09 DIAGNOSIS — M79604 Pain in right leg: Secondary | ICD-10-CM

## 2021-06-09 DIAGNOSIS — R262 Difficulty in walking, not elsewhere classified: Secondary | ICD-10-CM | POA: Diagnosis not present

## 2021-06-09 DIAGNOSIS — M5441 Lumbago with sciatica, right side: Secondary | ICD-10-CM

## 2021-06-09 NOTE — Therapy (Addendum)
?OUTPATIENT PHYSICAL THERAPY  TREATMENT ? ? ?Patient Name: Rachel Trevino ?MRN: [id number] ?DOB:July 09, 1950, 71 y.o., female ?Today's Date: 06/09/2021 ? ? PT End of Session - 06/09/21 1259   ? ? Visit Number 3   ? Number of Visits 20   ? Date for PT Re-Evaluation 07/29/21   ? Authorization Type [employer]   ? Authorization - Visit Number 3  ? PT Start Time 1300   ? PT Stop Time 6599   ? PT Time Calculation (min) 39 min   ? Activity Tolerance Patient limited by pain   ? Behavior During Therapy [facility] for tasks assessed/performed   ? ?  ?  ? ?  ? ? ? ? ?Past Medical History:  ?Diagnosis Date  ? Arthritis   ? oa right leg  ? Complication of anesthesia 2012  ? needed fiber optic,  scope for good vocal cord view, pt does not have letter from [facility]  on chart  ? Lymphedema 2012  ? left leg  ? Sarcoma of lower extremity, left ([place]) 2012  ? s/p surgery and radiation  ? ?Past Surgical History:  ?Procedure Laterality Date  ? APPENDECTOMY    ? open. denies long hospitilization with procedure  ? CERVICAL CONIZATION W/BX N/A 01/13/2018  ? Procedure: CONIZATION CERVIX;  Surgeon: Isabel Caprice, MD;  Location: [facility];  Service: Gynecology;  Laterality: N/A;  ? LASER ABLATION CONDOLAMATA N/A 01/13/2018  ? Procedure: LASER ABLATION  OF VAGINA;  Surgeon: Isabel Caprice, MD;  Location: [facility];  Service: Gynecology;  Laterality: N/A;  Need omin Laser for procedure. [name] spoke to [place] and I called office to let them that they needed to call [employer] to schedule rep to bring omni laser. Rep # [id number] or [phone] [name]. If unable to reach [name] may have a  ? left leg surgery for sarcoma  2012  ? and radiation  ? TONSILLECTOMY    ? TOTAL HIP ARTHROPLASTY Right 03/29/2015  ? Procedure: RIGHT TOTAL HIP ARTHROPLASTY ANTERIOR APPROACH;  Surgeon: Mcarthur Rossetti, MD;  Location: [facility];  Service: Orthopedics;  Laterality: Right;  ? TUBAL LIGATION    ? ?Patient Active Problem  List  ? Diagnosis Date Noted  ? Vaginal dysplasia   ? 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 Multinodular goiter 01/27/2017  ? Hyperthyroidism 01/27/2017  ? 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 Sarcoma ([place]) 01/05/2011  ? ? ?PCP: Jonathon Jordan, MD ? ?REFERRING PROVIDER: Pete Pelt, PA-C ? ?REFERRING DIAG: M54.16 (ICD-10-CM) - Lumbar radicular pain ? ?PERTINENT HISTORY: ?History of Lt LE sarcoma 2012, Rt THA 2017.  2 months onset insidious ? ?THERAPY DIAG:  ?Acute right-sided low back pain with right-sided sciatica ? ?Pain in right leg ? ?Muscle weakness (generalized) ? ?Difficulty in walking, not elsewhere classified ? ?ONSET DATE: 03/16/2021 (approx. 2 months) ? ?SUBJECTIVE:  ? ?SUBJECTIVE STATEMENT: ?Pt arrived with son and indicated some improvement in symptoms since her last time in.  Has been walking without cane.  Pt stated still having pain up to 8/10 from back to hip and down Rt leg.   HEP has been helpful sometimes for some reduction of symptoms.  ? ?PAIN:  ?Are you having pain? Yes ?NPRS scale: 8/10 ?Pain location: Rt leg, back ?PAIN TYPE: acute ?Pain description: cramping, tingling ?Aggravating factors: constant ?Relieving factors: medicine ? ?LIVING ENVIRONMENT: ?Lives with: lives with their  family and lives alone ?Lives in: House/apartment ?Stairs: No;  ? ? ?OCCUPATION: Retired ? ?PLOF: Independent, housework, taking care of grandson, walking for exercise (limited at this time) ? ?PATIENT GOALS  Reduce pain ? ? ?OBJECTIVE:  ? ?PATIENT SURVEYS:  ?05/20/2021: no foto gathered  ? ?COGNITION: ? 05/20/2021: Overall cognitive status: Within functional limits for tasks assessed   ?  ?SENSATION: ? 05/20/2021: Light touch: Appears intact ?  ? ?MUSCLE LENGTH: ?05/20/2021: passive SLR Rt 70 deg, Lt 80 deg ? ?POSTURE:  ?05/20/2021: no lateral shift noted in standing ? ?PALPATION: ?05/20/2021:  No specific tenderness noted in lumbar region today upon  palpation.  ? ? Lumbar AROM:  ?  ?AROM  ?05/20/2021 05/28/2021 06/09/2021  ?Flexion Movement to ankles no change in symptoms in Rt leg    ?Extension 50% WFL, repeated in standing x 10 improved to 75% WFL c centralization to upper thigh (resolved lower leg symptoms) Movement to neutral in standing c ERP lumbar and worsened Rt leg symptoms. ? ?Extension in prone press up on elbows worsened Rt leg symptoms  Movement to neutral in standing c ERP lumbar and worsened Rt leg symptoms  ?Lt lateral flexion     ?Rt lateral flexion     ?     ?     ?  ? 05/28/2021:  supine DKC reduced severity of symptoms mildly compared to performing.  ? ? ?LE AROM/PROM: ? ?A/PROM Right ?06/09/2021 Left ?06/09/2021  ?Hip flexion    ?Hip extension    ?Hip abduction    ?Hip adduction    ?Hip internal rotation    ?Hip external rotation    ?Knee flexion    ?Knee extension    ?Ankle dorsiflexion    ?Ankle plantarflexion    ?Ankle inversion    ?Ankle eversion    ? (Blank rows = not tested) ? ?LE MMT: ? ?MMT Right ?06/09/2021 Left ?06/09/2021  ?Hip flexion 4/5 5/5  ?Hip extension    ?Hip abduction    ?Hip adduction    ?Hip internal rotation    ?Hip external rotation    ?Knee flexion 5/5 5/5  ?Knee extension 5/5 5/5  ?Ankle dorsiflexion 5/5 5/5  ?Ankle plantarflexion    ?Ankle inversion    ?Ankle eversion    ? (Blank rows = not tested) ? ?LOWER EXTREMITY SPECIAL TESTS:  ?06/09/2021:  Long axis distraction to Rt leg reduced Rt leg symptoms in clinic.  ? ?05/20/2021: + slump on Rt,  (-) crossed SLR bilateral ? ?FUNCTIONAL TESTS:  ?05/20/2021: 18 inch chair transfer s UE on 1st try ? ? ?TODAY'S TREATMENT: ?06/09/2021: ?Therex:  Nustep 4 mins intermittent, lvl 5.  Stopped at 4 mins due to worsened symptom report ?  Standing extension AROM x 5 ?    Verbal review of existing HEP for continued home use.  ? ?   Manual:  Rt leg long axis distraction  ? ?Traction: Supine lumbar traction 85 lbs/60 lbs intermittent 60 sec/20 sec 15 mins with relief of Rt leg symptoms noted  during use.  ? ?05/28/2021: ?Therex:  HEP review c persistent cues tactile and verbal to ensure knowledge and procedures. ?  Prone press up on elbow 5 sec hold x 10 ?  Supine DKC x 10 (2-3 sec hold) ?  Supine glute set 5 sec hold x 10 ?  Supine Rt piriformis figure 4 stretch into flexion 30 sec x 3 ?  Supine sciatic nerve flossing x 20 on Rt (additional time c cues) ? ?  05/20/2021: ?Therex:  HEP instruction/performance c cues for techniques, handout provided.  Trial set performed of each for comprehension and symptom assessment.  See below for exercise list.  Additional time spent in education of directional preference for centralization in extension as noted in evaluation today.  ? ? ?PATIENT EDUCATION:  ?05/20/2021: ?Education details: HEP, POC ?Person educated: Patient ?Education method: Explanation, Demonstration, Verbal cues, and Handouts ?Education comprehension: verbalized understanding and returned demonstration ? ? ?HOME EXERCISE PROGRAM: ?05/28/2021: ?Access Code: [email or web address] ?URL: [email or web address] ?Date: 05/28/2021 ?Prepared by: Scot Jun ? ?Exercises ?Supine Piriformis Stretch with Foot on Ground - 2-3 x daily - 7 x weekly - 1 sets - 5 reps - 30 hold ?Supine 90/90 Sciatic Nerve Glide with Knee Flexion/Extension - 2-3 x daily - 7 x weekly - 3 sets - 10 reps ?Prone Press Up On Elbows - 2-3 x daily - 7 x weekly - 3 sets - 10 reps ?Supine Double Knee to Chest - 2-3 x daily - 7 x weekly - 1 sets - 10 reps - 5 hold ?Hooklying Gluteal Sets - 2-3 x daily - 7 x weekly - 1 sets - 10 reps - 5 hold ? ? ? ?ASSESSMENT: ? ?CLINICAL IMPRESSION: ?Pt saw MD between today and last visit with order for MRI to be scheduled (currently not).  Traction use and long axis distraction attempts provided relief of Rt leg symptoms while in clinic.  Continued lumbar mobility deficits noted overall c radicular symptoms present.  Pt ambulated in clinic independently today and overall demonstrated improved quality and speed  of progressive mobility (sit to stand, supine to sit, etc). Continued skilled PT services indicated at this time.  ? ? ?OBJECTIVE IMPAIRMENTS Abnormal gait, decreased activity tolerance, decreased balance, decr

## 2021-06-11 ENCOUNTER — Ambulatory Visit (INDEPENDENT_AMBULATORY_CARE_PROVIDER_SITE_OTHER): Payer: Medicare PPO | Admitting: Rehabilitative and Restorative Service Providers"

## 2021-06-11 ENCOUNTER — Encounter: Payer: Self-pay | Admitting: Rehabilitative and Restorative Service Providers"

## 2021-06-11 DIAGNOSIS — M79604 Pain in right leg: Secondary | ICD-10-CM | POA: Diagnosis not present

## 2021-06-11 DIAGNOSIS — M5441 Lumbago with sciatica, right side: Secondary | ICD-10-CM | POA: Diagnosis not present

## 2021-06-11 DIAGNOSIS — M6281 Muscle weakness (generalized): Secondary | ICD-10-CM | POA: Diagnosis not present

## 2021-06-11 DIAGNOSIS — R262 Difficulty in walking, not elsewhere classified: Secondary | ICD-10-CM

## 2021-06-11 NOTE — Therapy (Signed)
?OUTPATIENT PHYSICAL THERAPY  Treatment Visit ? ? ?Patient Name: Rachel Trevino ?MRN: [id number] ?DOB:September 11, 1950, 71 y.o., female ?Today's Date: 06/11/2021 ? ? PT End of Session - 06/11/21 1311   ? ? Visit Number 4   ? Number of Visits 20   ? Date for PT Re-Evaluation 07/29/21   ? Authorization Type [employer]   ? Authorization - Visit Number 4   ? PT Start Time 1258   ? PT Stop Time 1340   ? PT Time Calculation (min) 42 min   ? Activity Tolerance Patient limited by pain   ? Behavior During Therapy [facility] for tasks assessed/performed   ? ?  ?  ? ?  ? ? ? ? ? ?Past Medical History:  ?Diagnosis Date  ? Arthritis   ? oa right leg  ? Complication of anesthesia 2012  ? needed fiber optic,  scope for good vocal cord view, pt does not have letter from [facility]  on chart  ? Lymphedema 2012  ? left leg  ? Sarcoma of lower extremity, left ([place]) 2012  ? s/p surgery and radiation  ? ?Past Surgical History:  ?Procedure Laterality Date  ? APPENDECTOMY    ? open. denies long hospitilization with procedure  ? CERVICAL CONIZATION W/BX N/A 01/13/2018  ? Procedure: CONIZATION CERVIX;  Surgeon: Isabel Caprice, MD;  Location: [facility];  Service: Gynecology;  Laterality: N/A;  ? LASER ABLATION CONDOLAMATA N/A 01/13/2018  ? Procedure: LASER ABLATION  OF VAGINA;  Surgeon: Isabel Caprice, MD;  Location: [facility];  Service: Gynecology;  Laterality: N/A;  Need omin Laser for procedure. [name] spoke to [place] and I called office to let them that they needed to call [employer] to schedule rep to bring omni laser. Rep # [phone] or [phone] [name]. If unable to reach [name] may have a  ? left leg surgery for sarcoma  2012  ? and radiation  ? TONSILLECTOMY    ? TOTAL HIP ARTHROPLASTY Right 03/29/2015  ? Procedure: RIGHT TOTAL HIP ARTHROPLASTY ANTERIOR APPROACH;  Surgeon: Mcarthur Rossetti, MD;  Location: [facility];  Service: Orthopedics;  Laterality: Right;  ? TUBAL LIGATION    ? ?Patient Active  Problem List  ? Diagnosis Date Noted  ? Vaginal dysplasia   ? 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 Multinodular goiter 01/27/2017  ? Hyperthyroidism 01/27/2017  ? 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 Sarcoma ([place]) 01/05/2011  ? ? ?PCP: Jonathon Jordan, MD ? ?REFERRING PROVIDER: Pete Pelt, PA-C ? ?REFERRING DIAG: M54.16 (ICD-10-CM) - Lumbar radicular pain ? ?PERTINENT HISTORY: ?History of Lt LE sarcoma 2012, Rt THA 2017.  2 months onset insidious ? ?THERAPY DIAG:  ?Acute right-sided low back pain with right-sided sciatica ? ?Pain in right leg ? ?Muscle weakness (generalized) ? ?Difficulty in walking, not elsewhere classified ? ?ONSET DATE: 03/16/2021 (approx. 2 months) ? ?SUBJECTIVE:  ? ?SUBJECTIVE STATEMENT: ?Pt indicated pain "wasn't bad yesterday" and the day of last visit.  Reported pain in 10/10 range today from Rt hip to leg.  ? ?PAIN:  ?Are you having pain? Yes ?NPRS scale: 10/10 ?Pain location: Rt leg, back ?PAIN TYPE: acute ?Pain description: cramping, tingling ?Aggravating factors: constant (did have 48 hrs of improvement) ?Relieving factors: medicine ? ?LIVING ENVIRONMENT: ?Lives with: lives with their family and lives alone ?Lives in: House/apartment ?Stairs: No;  ? ? ?OCCUPATION: Retired ? ?PLOF: Independent,  housework, taking care of grandson, walking for exercise (limited at this time) ? ?PATIENT GOALS  Reduce pain ? ? ?OBJECTIVE:  ? ?PATIENT SURVEYS:  ?05/20/2021: no foto gathered  ? ?COGNITION: ? 05/20/2021: Overall cognitive status: Within functional limits for tasks assessed   ?  ?SENSATION: ? 05/20/2021: Light touch: Appears intact ?  ? ?MUSCLE LENGTH: ?05/20/2021: passive SLR Rt 70 deg, Lt 80 deg ? ?POSTURE:  ?05/20/2021: no lateral shift noted in standing ? ?PALPATION: ?05/20/2021:  No specific tenderness noted in lumbar region today upon palpation.  ? ? Lumbar AROM:  ?  ?AROM  ?05/20/2021 05/28/2021 06/09/2021 06/11/2021  ?Flexion Movement  to ankles no change in symptoms in Rt leg     ?Extension 50% WFL, repeated in standing x 10 improved to 75% WFL c centralization to upper thigh (resolved lower leg symptoms) Movement to neutral in standing c ERP lumbar and worsened Rt leg symptoms. ? ?Extension in prone press up on elbows worsened Rt leg symptoms  Movement to neutral in standing c ERP lumbar and worsened Rt leg symptoms Standing 25% WFL  ?Lt lateral flexion      ?Rt lateral flexion      ?      ?      ?  ? 05/28/2021:  supine DKC reduced severity of symptoms mildly compared to performing.  ? ? ?LE AROM/PROM: ? ?A/PROM Right ? Left ?  ?Hip flexion    ?Hip extension    ?Hip abduction    ?Hip adduction    ?Hip internal rotation    ?Hip external rotation    ?Knee flexion    ?Knee extension    ?Ankle dorsiflexion    ?Ankle plantarflexion    ?Ankle inversion    ?Ankle eversion    ? (Blank rows = not tested) ? ?LE MMT: ? ?MMT Right ?05/20/2021 Left ?05/20/2021  ?Hip flexion 4/5 5/5  ?Hip extension    ?Hip abduction    ?Hip adduction    ?Hip internal rotation    ?Hip external rotation    ?Knee flexion 5/5 5/5  ?Knee extension 5/5 5/5  ?Ankle dorsiflexion 5/5 5/5  ?Ankle plantarflexion    ?Ankle inversion    ?Ankle eversion    ? (Blank rows = not tested) ? ?LOWER EXTREMITY SPECIAL TESTS:  ?06/09/2021:  Long axis distraction to Rt leg reduced Rt leg symptoms in clinic.  ? ?05/20/2021: + slump on Rt,  (-) crossed SLR bilateral ? ?FUNCTIONAL TESTS:  ?05/20/2021: 18 inch chair transfer s UE on 1st try ? ? ?TODAY'S TREATMENT: ?06/11/2021: ?Therex:  Standing extension AROM x 5 ?    Supine figure 4 stretch Rt hip push away 15 sec x 3, pull towards 15 sec x 3 ?    Supine bridge to tolerance 2-3 second hold x 10 ?    Supine clam shell green band x 15 bilateral ? ?   Manual:  Rt leg long axis distraction  ? ?Traction: Supine lumbar traction 100 lbs/80 lbs intermittent 60 sec/20 sec 15 mins  ? ?06/09/2021: ?Therex:  Nustep 4 mins intermittent, lvl 5.  Stopped at 4 mins due to  worsened symptom report ?  Standing extension AROM x 5 ?    Verbal review of existing HEP for continued home use.  ? ?   Manual:  Rt leg long axis distraction  ? ?Traction: Supine lumbar traction 85 lbs/60 lbs intermittent 60 sec/20 sec 15 mins with relief of Rt leg symptoms noted during use.  ? ?  05/28/2021: ?Therex:  HEP review c persistent cues tactile and verbal to ensure knowledge and procedures. ?  Prone press up on elbow 5 sec hold x 10 ?  Supine DKC x 10 (2-3 sec hold) ?  Supine glute set 5 sec hold x 10 ?  Supine Rt piriformis figure 4 stretch into flexion 30 sec x 3 ?  Supine sciatic nerve flossing x 20 on Rt (additional time c cues) ? ?05/20/2021: ?Therex:  HEP instruction/performance c cues for techniques, handout provided.  Trial set performed of each for comprehension and symptom assessment.  See below for exercise list.  Additional time spent in education of directional preference for centralization in extension as noted in evaluation today.  ? ? ?PATIENT EDUCATION:  ?05/20/2021: ?Education details: HEP, POC ?Person educated: Patient ?Education method: Explanation, Demonstration, Verbal cues, and Handouts ?Education comprehension: verbalized understanding and returned demonstration ? ? ?HOME EXERCISE PROGRAM: ?05/28/2021: ?Access Code: [email or web address] ?URL: [email or web address] ?Date: 05/28/2021 ?Prepared by: Scot Jun ? ?Exercises ?Supine Piriformis Stretch with Foot on Ground - 2-3 x daily - 7 x weekly - 1 sets - 5 reps - 30 hold ?Supine 90/90 Sciatic Nerve Glide with Knee Flexion/Extension - 2-3 x daily - 7 x weekly - 3 sets - 10 reps ?Prone Press Up On Elbows - 2-3 x daily - 7 x weekly - 3 sets - 10 reps ?Supine Double Knee to Chest - 2-3 x daily - 7 x weekly - 1 sets - 10 reps - 5 hold ?Hooklying Gluteal Sets - 2-3 x daily - 7 x weekly - 1 sets - 10 reps - 5 hold ? ? ? ?ASSESSMENT: ? ?CLINICAL IMPRESSION: ?Pt indicated period of improvement in symptoms during traction use last visit and  between visits.  Arrival today had reporting of similar pain complaints as arrival last visit in Rt leg.  Long axis distraction and traction use did not fully reduce symptoms in performance today as of last

## 2021-06-13 ENCOUNTER — Telehealth: Payer: Self-pay | Admitting: Physician Assistant

## 2021-06-13 NOTE — Telephone Encounter (Signed)
Pt wanted to see if there was an update on her MRI being ordered. Pt stated it was being sent to [facility] to have completed. Pt was given phone number for GI to call as Artis Delay put those orders in at her previous appt on 06/04/21. The best call back number if needed is [phone].  ?

## 2021-06-16 ENCOUNTER — Telehealth: Payer: Self-pay

## 2021-06-16 ENCOUNTER — Ambulatory Visit
Admission: RE | Admit: 2021-06-16 | Discharge: 2021-06-16 | Disposition: A | Discharge status: Home / Self Care | Payer: Medicare PPO | Source: Ambulatory Visit | Attending: Physician Assistant | Admitting: Physician Assistant

## 2021-06-16 DIAGNOSIS — M4316 Spondylolisthesis, lumbar region: Secondary | ICD-10-CM | POA: Diagnosis not present

## 2021-06-16 DIAGNOSIS — M545 Low back pain, unspecified: Secondary | ICD-10-CM | POA: Diagnosis not present

## 2021-06-16 DIAGNOSIS — M48061 Spinal stenosis, lumbar region without neurogenic claudication: Secondary | ICD-10-CM | POA: Diagnosis not present

## 2021-06-16 DIAGNOSIS — M5416 Radiculopathy, lumbar region: Secondary | ICD-10-CM

## 2021-06-16 NOTE — Telephone Encounter (Signed)
Can you please call and schedule pt for post MRI appt.? ?

## 2021-06-16 NOTE — Telephone Encounter (Signed)
Pt is scheduled this morning for the MRI  ?

## 2021-06-17 ENCOUNTER — Telehealth: Payer: Self-pay | Admitting: Physician Assistant

## 2021-06-17 ENCOUNTER — Encounter: Payer: Self-pay | Admitting: Physical Therapy

## 2021-06-17 ENCOUNTER — Ambulatory Visit (INDEPENDENT_AMBULATORY_CARE_PROVIDER_SITE_OTHER): Payer: Medicare PPO | Admitting: Physical Therapy

## 2021-06-17 DIAGNOSIS — M5441 Lumbago with sciatica, right side: Secondary | ICD-10-CM

## 2021-06-17 DIAGNOSIS — M6281 Muscle weakness (generalized): Secondary | ICD-10-CM

## 2021-06-17 DIAGNOSIS — R262 Difficulty in walking, not elsewhere classified: Secondary | ICD-10-CM | POA: Diagnosis not present

## 2021-06-17 DIAGNOSIS — M79604 Pain in right leg: Secondary | ICD-10-CM

## 2021-06-17 NOTE — Telephone Encounter (Signed)
Patient's son Rachel Trevino called asked if patient can be worked in for a earlier appointment. Rachel Trevino said patient is hurting pretty bad and he feel patient is sliding toward depression.  Rachel Trevino said he's pretty worried about his mother. The number to contact Rachel Trevino is [phone] or  ? [phone] Number is to the office at the [facility] with [facility]) ?

## 2021-06-17 NOTE — Therapy (Addendum)
?OUTPATIENT PHYSICAL THERAPY  Treatment Visit /DISCHARGE ? ? ?Patient Name: Rachel Trevino ?MRN: [id number] ?DOB:September 27, 1950, 71 y.o., female ?Today's Date: 06/17/2021 ? ? PT End of Session - 06/17/21 0949   ? ? Visit Number 5   ? Number of Visits 20   ? Date for PT Re-Evaluation 07/29/21   ? Authorization Type [employer] 12 visits until 5/16   ? Authorization - Visit Number 5   ? Authorization - Number of Visits 12   ? PT Start Time 0930   ? PT Stop Time 1013   ? PT Time Calculation (min) 43 min   ? Activity Tolerance Patient limited by pain   ? Behavior During Therapy [facility] for tasks assessed/performed   ? ?  ?  ? ?  ? ? ? ? ? ?Past Medical History:  ?Diagnosis Date  ? Arthritis   ? oa right leg  ? Complication of anesthesia 2012  ? needed fiber optic,  scope for good vocal cord view, pt does not have letter from [facility]  on chart  ? Lymphedema 2012  ? left leg  ? Sarcoma of lower extremity, left ([place]) 2012  ? s/p surgery and radiation  ? ?Past Surgical History:  ?Procedure Laterality Date  ? APPENDECTOMY    ? open. denies long hospitilization with procedure  ? CERVICAL CONIZATION W/BX N/A 01/13/2018  ? Procedure: CONIZATION CERVIX;  Surgeon: Isabel Caprice, MD;  Location: [facility];  Service: Gynecology;  Laterality: N/A;  ? LASER ABLATION CONDOLAMATA N/A 01/13/2018  ? Procedure: LASER ABLATION  OF VAGINA;  Surgeon: Isabel Caprice, MD;  Location: [facility];  Service: Gynecology;  Laterality: N/A;  Need omin Laser for procedure. [name] spoke to [place] and I called office to let them that they needed to call [employer] to schedule rep to bring omni laser. Rep # [phone] or [phone] [name]. If unable to reach [name] may have a  ? left leg surgery for sarcoma  2012  ? and radiation  ? TONSILLECTOMY    ? TOTAL HIP ARTHROPLASTY Right 03/29/2015  ? Procedure: RIGHT TOTAL HIP ARTHROPLASTY ANTERIOR APPROACH;  Surgeon: Mcarthur Rossetti, MD;  Location: [facility];  Service:  Orthopedics;  Laterality: Right;  ? TUBAL LIGATION    ? ?Patient Active Problem List  ? Diagnosis Date Noted  ? Vaginal dysplasia   ? 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 Multinodular goiter 01/27/2017  ? Hyperthyroidism 01/27/2017  ? 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 Sarcoma ([place]) 01/05/2011  ? ? ?PCP: Jonathon Jordan, MD ? ?REFERRING PROVIDER: Pete Pelt, PA-C ? ?REFERRING DIAG: M54.16 (ICD-10-CM) - Lumbar radicular pain ? ?PERTINENT HISTORY: ?History of Lt LE sarcoma 2012, Rt THA 2017.  2 months onset insidious ? ?THERAPY DIAG:  ?Acute right-sided low back pain with right-sided sciatica ? ?Pain in right leg ? ?Muscle weakness (generalized) ? ?Difficulty in walking, not elsewhere classified ? ?ONSET DATE: 03/16/2021 (approx. 2 months) ? ?SUBJECTIVE:  ? ?SUBJECTIVE STATEMENT: ?Pt indicated pain is really bad today all the way down her right leg to her foot with tingling. She did feel the traction helped last time but did not want as much pull today ? ?PAIN:  ?Are you having pain? Yes ?NPRS scale: 10/10 ?Pain location: Rt leg, back ?PAIN TYPE: acute ?Pain description: cramping, tingling ?Aggravating factors: constant (did have 48 hrs of improvement) ?Relieving factors: medicine ? ?  LIVING ENVIRONMENT: ?Lives with: lives with their family and lives alone ?Lives in: House/apartment ?Stairs: No;  ? ? ?OCCUPATION: Retired ? ?PLOF: Independent, housework, taking care of grandson, walking for exercise (limited at this time) ? ?PATIENT GOALS  Reduce pain ? ? ?OBJECTIVE:  ? ?PATIENT SURVEYS:  ?05/20/2021: no foto gathered  ? ?COGNITION: ? 05/20/2021: Overall cognitive status: Within functional limits for tasks assessed   ?  ?SENSATION: ? 05/20/2021: Light touch: Appears intact ?  ? ?MUSCLE LENGTH: ?05/20/2021: passive SLR Rt 70 deg, Lt 80 deg ? ?POSTURE:  ?05/20/2021: no lateral shift noted in standing ? ?PALPATION: ?05/20/2021:  No specific tenderness noted in  lumbar region today upon palpation.  ? ? Lumbar AROM:  ?  ?AROM  ?05/20/2021 05/28/2021 06/09/2021 06/11/2021  ?Flexion Movement to ankles no change in symptoms in Rt leg     ?Extension 50% WFL, repeated in standing x 10 improved to 75% WFL c centralization to upper thigh (resolved lower leg symptoms) Movement to neutral in standing c ERP lumbar and worsened Rt leg symptoms. ? ?Extension in prone press up on elbows worsened Rt leg symptoms  Movement to neutral in standing c ERP lumbar and worsened Rt leg symptoms Standing 25% WFL  ?Lt lateral flexion      ?Rt lateral flexion      ?      ?      ?  ? 05/28/2021:  supine DKC reduced severity of symptoms mildly compared to performing.  ? ? ?LE AROM/PROM: ? ?A/PROM Right ? Left ?  ?Hip flexion    ?Hip extension    ?Hip abduction    ?Hip adduction    ?Hip internal rotation    ?Hip external rotation    ?Knee flexion    ?Knee extension    ?Ankle dorsiflexion    ?Ankle plantarflexion    ?Ankle inversion    ?Ankle eversion    ? (Blank rows = not tested) ? ?LE MMT: ? ?MMT Right ?05/20/2021 Left ?05/20/2021  ?Hip flexion 4/5 5/5  ?Hip extension    ?Hip abduction    ?Hip adduction    ?Hip internal rotation    ?Hip external rotation    ?Knee flexion 5/5 5/5  ?Knee extension 5/5 5/5  ?Ankle dorsiflexion 5/5 5/5  ?Ankle plantarflexion    ?Ankle inversion    ?Ankle eversion    ? (Blank rows = not tested) ? ?LOWER EXTREMITY SPECIAL TESTS:  ?06/09/2021:  Long axis distraction to Rt leg reduced Rt leg symptoms in clinic.  ? ?05/20/2021: + slump on Rt,  (-) crossed SLR bilateral ? ?FUNCTIONAL TESTS:  ?05/20/2021: 18 inch chair transfer s UE on 1st try ? ?New lumbar MRI  ?1. L5-S1 right paracentral extrusion with right S1 impingement. ?2. Background of generalized lumbar spine degeneration with L4-5 ?anterolisthesis. ?3. L5-S1 moderate left foraminal narrowing. ?4. L4-5 moderate left subarticular recess stenosis. ? ? ?TODAY'S TREATMENT: ?06/11/2021: ?Therex:  Standing extension AROM x 10 at  wall ?  Standing hip extensions X10 bilat at wall ?    Supine figure 4 stretch Rt hip push away 15 sec x 3, pull towards 15 sec x 3 ?    Supine bridge to tolerance 2-3 second hold x 10 ?    LTR 5 sec X10 ?    Seated sciatic nerve glide 3 sec X10 on Rt ? ?Traction: Supine lumbar traction 90 lbs/70 lbs intermittent 60 sec/20 sec 15 mins (20 min total time with set up) ? ?06/11/2021: ?Therex:  Standing extension AROM x 5 ?    Supine figure 4 stretch Rt hip push away 15 sec x 3, pull towards 15 sec x 3 ?    Supine bridge to tolerance 2-3 second hold x 10 ?    Supine clam shell green band x 15 bilateral ? ?   Manual:  Rt leg long axis distraction  ? ?Traction: Supine lumbar traction 100 lbs/80 lbs intermittent 60 sec/20 sec 15 mins  ? ?06/09/2021: ?Therex:  Nustep 4 mins intermittent, lvl 5.  Stopped at 4 mins due to worsened symptom report ?  Standing extension AROM x 5 ?    Verbal review of existing HEP for continued home use.  ? ?   Manual:  Rt leg long axis distraction  ? ?Traction: Supine lumbar traction 85 lbs/60 lbs intermittent 60 sec/20 sec 15 mins with relief of Rt leg symptoms noted during use.  ? ? ?PATIENT EDUCATION:  ?05/20/2021: ?Education details: HEP, POC ?Person educated: Patient ?Education method: Explanation, Demonstration, Verbal cues, and Handouts ?Education comprehension: verbalized understanding and returned demonstration ? ? ?HOME EXERCISE PROGRAM: ?05/28/2021: ?Access Code: [email or web address] ?URL: [email or web address] ?Date: 05/28/2021 ?Prepared by: Scot Jun ? ?Exercises ?Supine Piriformis Stretch with Foot on Ground - 2-3 x daily - 7 x weekly - 1 sets - 5 reps - 30 hold ?Supine 90/90 Sciatic Nerve Glide with Knee Flexion/Extension - 2-3 x daily - 7 x weekly - 3 sets - 10 reps ?Prone Press Up On Elbows - 2-3 x daily - 7 x weekly - 3 sets - 10 reps ?Supine Double Knee to Chest - 2-3 x daily - 7 x weekly - 1 sets - 10 reps - 5 hold ?Hooklying Gluteal Sets - 2-3 x daily - 7 x weekly - 1 sets  - 10 reps - 5 hold ? ? ? ?ASSESSMENT: ? ?CLINICAL IMPRESSION: ?We again performed traction as she felt this helped some last time but used less pull at her request. She does have new MRI results in her chart and w

## 2021-06-25 ENCOUNTER — Encounter: Payer: Self-pay | Admitting: Physician Assistant

## 2021-06-25 ENCOUNTER — Encounter: Payer: Medicare PPO | Admitting: Rehabilitative and Restorative Service Providers"

## 2021-06-25 ENCOUNTER — Ambulatory Visit: Payer: Medicare PPO | Admitting: Physician Assistant

## 2021-06-25 DIAGNOSIS — M5416 Radiculopathy, lumbar region: Secondary | ICD-10-CM | POA: Diagnosis not present

## 2021-06-25 NOTE — Progress Notes (Signed)
HPI: [name] returns today for follow-up from the MRI of her lumbar spine.  She still having significant pain.  Pain radiates down her right leg from the buttocks region into the foot.  She has tried physical therapy and feels like minimally.  ?MRI of the lumbar spine dated 06/16/2021 showed an L5-S1 right paracentral disc extrusion with impingement on the right S1 nerve root.  L5-S1 moderate left foraminal stenosis.  L4-5 grade 1 anterior listhesis.  L4-5 moderate left subarticular recess stenosis.  My images were reviewed with patient and her son who is present today. ? ?Physical exam: Bilateral lower extremities 5 and 5 strength throughout lower extremities except for hip flexion on the right.  Hip flexion causes discomfort on the right also. ? ?Impression: Lumbar radiculopathy right lower extremity with disc extrusion L5-S1 ? ?Plan: We will refer to neurosurgery for evaluation and treatment.  Copy of the MRI was given.  Questions were encouraged and answered patient and her son who is present today. ?

## 2021-06-27 ENCOUNTER — Encounter: Payer: Medicare PPO | Admitting: Rehabilitative and Restorative Service Providers"

## 2021-06-27 ENCOUNTER — Telehealth: Payer: Self-pay | Admitting: Rehabilitative and Restorative Service Providers"

## 2021-06-27 NOTE — Telephone Encounter (Signed)
Spoke with patient about missed appointment today.  Pt indicated she wanted to see neurosurgeon and hold PT at this time.  ? ?Scot Jun, PT, DPT, OCS, ATC ?06/27/21  2:32 PM ? ? ?

## 2021-07-01 ENCOUNTER — Encounter: Payer: Medicare PPO | Admitting: Rehabilitative and Restorative Service Providers"

## 2021-07-01 ENCOUNTER — Ambulatory Visit: Payer: Medicare PPO | Admitting: Endocrinology

## 2021-07-03 ENCOUNTER — Encounter: Payer: Medicare PPO | Admitting: Rehabilitative and Restorative Service Providers"

## 2021-07-05 ENCOUNTER — Other Ambulatory Visit: Payer: Self-pay | Admitting: Physician Assistant

## 2021-07-06 ENCOUNTER — Other Ambulatory Visit (HOSPITAL_BASED_OUTPATIENT_CLINIC_OR_DEPARTMENT_OTHER): Payer: Self-pay | Admitting: Orthopaedic Surgery

## 2021-07-06 MED ORDER — METHOCARBAMOL 500 MG PO TABS: 500.0000 mg | ORAL_TABLET | Freq: Four times a day (QID) | ORAL | 1 refills | Status: DC

## 2021-07-08 ENCOUNTER — Encounter: Payer: Medicare PPO | Admitting: Rehabilitative and Restorative Service Providers"

## 2021-07-08 DIAGNOSIS — M5126 Other intervertebral disc displacement, lumbar region: Secondary | ICD-10-CM | POA: Diagnosis not present

## 2021-07-10 ENCOUNTER — Encounter: Payer: Medicare PPO | Admitting: Rehabilitative and Restorative Service Providers"

## 2021-07-11 ENCOUNTER — Other Ambulatory Visit: Payer: Self-pay | Admitting: Neurosurgery

## 2021-07-13 MED ORDER — METHOCARBAMOL 500 MG PO TABS
500.0000 mg | ORAL_TABLET | Freq: Four times a day (QID) | ORAL | 2 refills | Status: DC
  Filled 2021-07-13: qty 30, 8d supply, fill #0

## 2021-07-14 ENCOUNTER — Other Ambulatory Visit (HOSPITAL_BASED_OUTPATIENT_CLINIC_OR_DEPARTMENT_OTHER): Payer: Self-pay

## 2021-07-14 NOTE — Progress Notes (Signed)
Surgical Instructions ? ? ? Your procedure is scheduled on May 3rd Wednesday. ? Report to [facility] Main Entrance "A" at 10 A.M., then check in with the Admitting office. ? Call this number if you have problems the morning of surgery: ? [phone] ? ? If you have any questions prior to your surgery date call [phone]: Open Monday-Friday 8am-4pm ? ? ? Remember: ? Do not eat after midnight the night before your surgery ? ?You may drink clear liquids until 9 the morning of your surgery.   ?Clear liquids allowed are: Water, Non-Citrus Juices (without pulp), Carbonated Beverages, Clear Tea, Black Coffee ONLY (NO MILK, CREAM OR POWDERED CREAMER of any kind), and Gatorade ?  ? Take these medicines the morning of surgery with A SIP OF WATER: ?Apoaequorin (PREVAGEN) 10 MG CAPS ?methimazole (TAPAZOLE) 5 MG tablet ?mVYZULTA 0.024 % SOLNethocarbamol (ROBAXIN) 500 MG tablet ? ?IF NEEDED  ?acetaminophen (TYLENOL) 500 MG tablet ? ? ? ?Follow your surgeon's instructions on when to stop Aspirin.  If no instructions were given by your surgeon then you will need to call the office to get those instructions.   ? ? ?As of today, STOP taking any Aspirin (unless otherwise instructed by your surgeon) Aleve, Naproxen, Ibuprofen, Motrin, Advil, Goody's, BC's, all herbal medications, fish oil, and all vitamins. ? ?         ?Do not wear jewelry or makeup ?Do not wear lotions, powders, perfumes, or deodorant. ?Do not shave 48 hours prior to surgery.   ?Do not bring valuables to the hospital. ?Do not wear nail polish, gel polish, artificial nails, or any other type of covering on natural nails (fingers and toes) ?If you have artificial nails or gel coating that need to be removed by a nail salon, please have this removed prior to surgery. Artificial nails or gel coating may interfere with anesthesia's ability to adequately monitor your vital signs. ? ?[facility] is not responsible for any belongings or valuables. .  ? ?Do NOT Smoke  (Tobacco/Vaping)  24 hours prior to your procedure ? ?If you use a CPAP at night, you may bring your mask for your overnight stay. ?  ?Contacts, glasses, hearing aids, dentures or partials may not be worn into surgery, please bring cases for these belongings ?  ?For patients admitted to the hospital, discharge time will be determined by your treatment team. ?  ?Patients discharged the day of surgery will not be allowed to drive home, and someone needs to stay with them for 24 hours. ? ? ?SURGICAL WAITING ROOM VISITATION ?Patients having surgery or a procedure in a hospital may have two support people. ?Children under the age of 6 must have an adult with them who is not the patient. ?They may stay in the waiting area during the procedure and may switch out with other visitors. If the patient needs to stay at the hospital during part of their recovery, the visitor guidelines for inpatient rooms apply. ? ?Please refer to the [facility] website for the visitor guidelines for Inpatients (after your surgery is over and you are in a regular room).  ? ? ? ? ? ?Special instructions:   ? ?Oral Hygiene is also important to reduce your risk of infection.  Remember - BRUSH YOUR TEETH THE MORNING OF SURGERY WITH YOUR REGULAR TOOTHPASTE ? ? ?- Preparing For Surgery ? ?Before surgery, you can play an important role. Because skin is not sterile, your skin needs to be as free of germs  as possible. You can reduce the number of germs on your skin by washing with CHG (chlorahexidine gluconate) Soap before surgery.  CHG is an antiseptic cleaner which kills germs and bonds with the skin to continue killing germs even after washing.   ? ? ?Please do not use if you have an allergy to CHG or antibacterial soaps. If your skin becomes reddened/irritated stop using the CHG.  ?Do not shave (including legs and underarms) for at least 48 hours prior to first CHG shower. It is OK to shave your face. ? ?Please follow these instructions  carefully. ?  ? ? Shower the NIGHT BEFORE SURGERY and the MORNING OF SURGERY with CHG Soap.  ? If you chose to wash your hair, wash your hair first as usual with your normal shampoo. After you shampoo, rinse your hair and body thoroughly to remove the shampoo.  Then [employer] and genitals (private parts) with your normal soap and rinse thoroughly to remove soap. ? ?After that Use CHG Soap as you would any other liquid soap. You can apply CHG directly to the skin and wash gently with a scrungie or a clean washcloth.  ? ?Apply the CHG Soap to your body ONLY FROM THE NECK DOWN.  Do not use on open wounds or open sores. Avoid contact with your eyes, ears, mouth and genitals (private parts). Wash Face and genitals (private parts)  with your normal soap.  ? ?Wash thoroughly, paying special attention to the area where your surgery will be performed. ? ?Thoroughly rinse your body with warm water from the neck down. ? ?DO NOT shower/wash with your normal soap after using and rinsing off the CHG Soap. ? ?Pat yourself dry with a CLEAN TOWEL. ? ?Wear CLEAN PAJAMAS to bed the night before surgery ? ?Place CLEAN SHEETS on your bed the night before your surgery ? ?DO NOT SLEEP WITH PETS. ? ? ?Day of Surgery: ? ?Take a shower with CHG soap. ?Wear Clean/Comfortable clothing the morning of surgery ?Do not apply any deodorants/lotions.   ?Remember to brush your teeth WITH YOUR REGULAR TOOTHPASTE. ? ? ? ?If you received a COVID test during your pre-op visit, it is requested that you wear a mask when out in public, stay away from anyone that may not be feeling well, and notify your surgeon if you develop symptoms. If you have been in contact with anyone that has tested positive in the last 10 days, please notify your surgeon. ? ?  ?Please read over the following fact sheets that you were given.  ? ?

## 2021-07-15 ENCOUNTER — Encounter (HOSPITAL_COMMUNITY)
Admission: RE | Admit: 2021-07-15 | Discharge: 2021-07-15 | Disposition: A | Discharge status: Home / Self Care | Payer: Medicare PPO | Source: Ambulatory Visit | Attending: Neurosurgery | Admitting: Neurosurgery

## 2021-07-15 ENCOUNTER — Encounter (HOSPITAL_COMMUNITY): Payer: Self-pay

## 2021-07-15 VITALS — BP 160/102 | HR 113 | Temp 98.0°F | Resp 17 | Ht 64.0 in | Wt 155.1 lb

## 2021-07-15 DIAGNOSIS — Z01818 Encounter for other preprocedural examination: Secondary | ICD-10-CM | POA: Insufficient documentation

## 2021-07-15 DIAGNOSIS — R030 Elevated blood-pressure reading, without diagnosis of hypertension: Secondary | ICD-10-CM | POA: Insufficient documentation

## 2021-07-15 DIAGNOSIS — M1611 Unilateral primary osteoarthritis, right hip: Secondary | ICD-10-CM | POA: Diagnosis not present

## 2021-07-15 LAB — CBC
HCT: 39.9 % (ref 36.0–46.0)
Hemoglobin: 14.5 g/dL (ref 12.0–15.0)
MCH: 29.4 pg (ref 26.0–34.0)
MCHC: 36.3 g/dL — ABNORMAL HIGH (ref 30.0–36.0)
MCV: 80.8 fL (ref 80.0–100.0)
Platelets: 366 10*3/uL (ref 150–400)
RBC: 4.94 MIL/uL (ref 3.87–5.11)
RDW: 14.1 % (ref 11.5–15.5)
WBC: 8.3 10*3/uL (ref 4.0–10.5)
nRBC: 0 % (ref 0.0–0.2)

## 2021-07-15 LAB — BASIC METABOLIC PANEL
Anion gap: 8 (ref 5–15)
BUN: 9 mg/dL (ref 8–23)
CO2: 24 mmol/L (ref 22–32)
Calcium: 9.9 mg/dL (ref 8.9–10.3)
Chloride: 107 mmol/L (ref 98–111)
Creatinine, Ser: 0.73 mg/dL (ref 0.44–1.00)
GFR, Estimated: >60 mL/min (ref >60)
Glucose, Bld: 97 mg/dL (ref 70–99)
Potassium: 4.1 mmol/L (ref 3.5–5.1)
Sodium: 139 mmol/L (ref 135–145)

## 2021-07-15 LAB — SURGICAL PCR SCREEN
MRSA, PCR: NEGATIVE
Staphylococcus aureus: POSITIVE — AB

## 2021-07-15 NOTE — Progress Notes (Addendum)
PCP - Dr. Jonathon Jordan ?Cardiologist - Denies ? ?PPM/ICD - Denies ? ?Chest x-ray - Not indicated ?EKG - 07/15/21 ?Stress Test - Denies ?ECHO - Denies ?Cardiac Cath - Denies ? ?Sleep Study - Denies ? ?DM -Denies ? ?Anesthesia review: Yes, patient's B/P elevated, asymptomatic. Spoke with Jeneen Rinks and got additional testing done.  Also requesting anesthesia record from 08/22/10 surgery done at [facility].  ? ?Patient denies shortness of breath, fever, cough and chest pain at PAT appointment ? ? ?All instructions explained to the patient, with a verbal understanding of the material. Patient agrees to go over the instructions while at home for a better understanding.  The opportunity to ask questions was provided. ? ? ?

## 2021-07-15 NOTE — Progress Notes (Addendum)
Anesthesia Chart Review: ? ?Patient's blood pressure noted to be elevated preadmission testing.  164/111 on arrival, 160/102 on manual recheck.  Patient does not have diagnosis of hypertension.  She takes no antihypertensive medications.  She denies any symptoms of headache, chest pain, shortness of breath.  She says she has never had pressures this high before.  Heart rate also initially elevated at 113.  She admitted to significant pain due to lumbar disc and significant anxiety over upcoming surgery.  EKG shows NSR.  Rate 91.  Discussed with patient and markedly uncontrolled blood pressure on day of surgery could be cause for cancellation, however, given recheck of 160/102 anticipate she will be able to proceed.  She does understand that she should continue to monitor blood pressure after surgery to make sure that does not remain elevated.  She was advised to follow-up with her PCP for this. ? 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  Patient did not recall this.  Stated she was not aware of any difficult intubation history.  However, prior documentation states failed attempt with GlideScope and eventual use of a sleep fiberoptic intubation.  Family members were able to confirm this.  On exam, patient does have very limited oral opening as well as large tongue.  Anticipate difficulty.  Anesthesia records from that procedure have been urgently requested from [facility]. ? ?Throughout conversation with patient, I also had some concerns about memory issues/cognitive decline.  She had difficulty remembering past surgeries and past medical history.  Family was able to corroborate this, stating they have also noticed a decline in patient's memory/cognition, particularly over the last several months.  Patient's son is HCPOA and will accompany her to preop tomorrow to assist in answering questions and providing medical history. ? ?Preop labs  reviewed, unremarkable.  ? ? ?Karoline Caldwell, PA-C ?[facility] Short Stay Center/Anesthesiology ?Phone [phone] ?07/16/2021 10:53 AM ? ? ? ? ? ? ?

## 2021-07-15 NOTE — Anesthesia Preprocedure Evaluation (Addendum)
Anesthesia Evaluation  ?Patient identified by MRN, date of birth, ID band ?Patient awake ? ? ? ?Reviewed: ?Allergy & Precautions, NPO status , Patient's Chart, lab work & pertinent test results ? ?History of Anesthesia Complications ?(+) DIFFICULT AIRWAY and history of anesthetic complications ? ?Airway ?Mallampati: IV ? ?TM Distance: >3 FB ?Neck ROM: Full ? ? ? Dental ?no notable dental hx. ? ?  ?Pulmonary ?neg pulmonary ROS,  ?  ?Pulmonary exam normal ? ? ? ? ? ? ? Cardiovascular ?negative cardio ROS ?Normal cardiovascular exam ? ?ECG: NSR, rate 91 ?  ?Neuro/Psych ?negative neurological ROS ? negative psych ROS  ? GI/Hepatic ?negative GI ROS, Neg liver ROS,   ?Endo/Other  ?negative endocrine ROS ? Renal/GU ?negative Renal ROS  ? ?  ?Musculoskeletal ? ?(+) Arthritis ,  ? Abdominal ?  ?Peds ? Hematology ?negative hematology ROS ?(+)   ?Anesthesia Other Findings ?HNP ? Reproductive/Obstetrics ? ?  ? ? ? ? ? ? ? ? ? ? ? ? ? ?  ?  ? ? ? ? ? ? ? ?Anesthesia Physical ?Anesthesia Plan ? ?ASA: 2 ? ?Anesthesia Plan: General  ? ?Post-op Pain Management:   ? ?Induction: Intravenous ? ?PONV Risk Score and Plan: 3 and Ondansetron, Dexamethasone and Treatment may vary due to age or medical condition ? ?Airway Management Planned: Oral ETT and Video Laryngoscope Planned ? ?Additional Equipment:  ? ?Intra-op Plan:  ? ?Post-operative Plan: Extubation in OR ? ?Informed Consent: I have reviewed the patients History and Physical, chart, labs and discussed the procedure including the risks, benefits and alternatives for the proposed anesthesia with the patient or authorized representative who has indicated his/her understanding and acceptance.  ? ? ? ?Dental advisory given ? ?Plan Discussed with: CRNA ? ?Anesthesia Plan Comments: (PAT note by Karoline Caldwell, PA-C ?)  ? ? ? ? ?Anesthesia Quick Evaluation ? ?

## 2021-07-16 ENCOUNTER — Other Ambulatory Visit: Payer: Self-pay

## 2021-07-16 ENCOUNTER — Ambulatory Visit (HOSPITAL_COMMUNITY): Payer: Medicare PPO | Admitting: Physician Assistant

## 2021-07-16 ENCOUNTER — Ambulatory Visit (HOSPITAL_BASED_OUTPATIENT_CLINIC_OR_DEPARTMENT_OTHER): Payer: Medicare PPO | Admitting: Anesthesiology

## 2021-07-16 ENCOUNTER — Ambulatory Visit (HOSPITAL_COMMUNITY): Payer: Medicare PPO

## 2021-07-16 ENCOUNTER — Ambulatory Visit (HOSPITAL_COMMUNITY)
Admission: RE | Admit: 2021-07-16 | Discharge: 2021-07-17 | Disposition: A | Discharge status: Home / Self Care | Payer: Medicare PPO | Source: Ambulatory Visit | Attending: Neurosurgery | Admitting: Neurosurgery

## 2021-07-16 ENCOUNTER — Ambulatory Visit (HOSPITAL_COMMUNITY)
Admission: RE | Disposition: A | Discharge status: Home / Self Care | Payer: Self-pay | Source: Ambulatory Visit | Attending: Neurosurgery

## 2021-07-16 ENCOUNTER — Encounter (HOSPITAL_COMMUNITY): Payer: Self-pay | Admitting: Neurosurgery

## 2021-07-16 DIAGNOSIS — M5126 Other intervertebral disc displacement, lumbar region: Secondary | ICD-10-CM | POA: Diagnosis not present

## 2021-07-16 DIAGNOSIS — M4316 Spondylolisthesis, lumbar region: Secondary | ICD-10-CM | POA: Diagnosis not present

## 2021-07-16 DIAGNOSIS — M5117 Intervertebral disc disorders with radiculopathy, lumbosacral region: Secondary | ICD-10-CM | POA: Diagnosis not present

## 2021-07-16 DIAGNOSIS — M6281 Muscle weakness (generalized): Secondary | ICD-10-CM | POA: Insufficient documentation

## 2021-07-16 HISTORY — PX: LUMBAR LAMINECTOMY/DECOMPRESSION MICRODISCECTOMY: SHX5026

## 2021-07-16 SURGERY — LUMBAR LAMINECTOMY/DECOMPRESSION MICRODISCECTOMY 1 LEVEL
Anesthesia: General | Site: Spine Lumbar | Laterality: Right

## 2021-07-16 MED ORDER — BUPIVACAINE HCL (PF) 0.25 % IJ SOLN
INTRAMUSCULAR | Status: AC
  Filled 2021-07-16: qty 30

## 2021-07-16 MED ORDER — ACETAMINOPHEN 500 MG PO TABS: 500.0000 mg | ORAL_TABLET | Freq: Four times a day (QID) | ORAL | Status: DC | PRN

## 2021-07-16 MED ORDER — ONDANSETRON HCL 4 MG/2ML IJ SOLN: 4.0000 mg | Freq: Four times a day (QID) | INTRAMUSCULAR | Status: DC | PRN

## 2021-07-16 MED ORDER — ONDANSETRON HCL 4 MG/2ML IJ SOLN: 4.0000 mg | Freq: Once | INTRAMUSCULAR | Status: DC | PRN

## 2021-07-16 MED ORDER — ADULT MULTIVITAMIN W/MINERALS CH
1.0000 | ORAL_TABLET | Freq: Every day | ORAL | Status: DC
  Administered 2021-07-16 – 2021-07-17 (×2): 1 via ORAL
  Filled 2021-07-16 (×2): qty 1

## 2021-07-16 MED ORDER — THROMBIN (RECOMBINANT) 5000 UNITS EX SOLR: CUTANEOUS | Status: DC | PRN

## 2021-07-16 MED ORDER — ONDANSETRON HCL 4 MG PO TABS: 4.0000 mg | ORAL_TABLET | Freq: Four times a day (QID) | ORAL | Status: DC | PRN

## 2021-07-16 MED ORDER — SUGAMMADEX SODIUM 200 MG/2ML IV SOLN
INTRAVENOUS | Status: DC | PRN
  Administered 2021-07-16: 200 mg via INTRAVENOUS

## 2021-07-16 MED ORDER — 0.9 % SODIUM CHLORIDE (POUR BTL) OPTIME
TOPICAL | Status: DC | PRN
  Administered 2021-07-16: 1000 mL

## 2021-07-16 MED ORDER — SODIUM CHLORIDE 0.9% FLUSH: 3.0000 mL | INTRAVENOUS | Status: DC | PRN

## 2021-07-16 MED ORDER — CHLORHEXIDINE GLUCONATE CLOTH 2 % EX PADS: 6.0000 | MEDICATED_PAD | Freq: Once | CUTANEOUS | Status: DC

## 2021-07-16 MED ORDER — CHLORHEXIDINE GLUCONATE CLOTH 2 % EX PADS
6.0000 | MEDICATED_PAD | Freq: Once | CUTANEOUS | Status: DC
Start: 2021-07-16 — End: 2021-07-16

## 2021-07-16 MED ORDER — PHENYLEPHRINE HCL (PRESSORS) 10 MG/ML IV SOLN
INTRAVENOUS | Status: AC
  Filled 2021-07-16: qty 1

## 2021-07-16 MED ORDER — SUCCINYLCHOLINE CHLORIDE 200 MG/10ML IV SOSY
PREFILLED_SYRINGE | INTRAVENOUS | Status: DC | PRN
  Administered 2021-07-16: 80 mg via INTRAVENOUS

## 2021-07-16 MED ORDER — LIDOCAINE-EPINEPHRINE 1 %-1:100000 IJ SOLN
INTRAMUSCULAR | Status: DC | PRN
  Administered 2021-07-16: 10 mL

## 2021-07-16 MED ORDER — ORAL CARE MOUTH RINSE: 15.0000 mL | Freq: Once | OROMUCOSAL | Status: AC

## 2021-07-16 MED ORDER — PREDNISONE 20 MG PO TABS
20.0000 mg | ORAL_TABLET | Freq: Two times a day (BID) | ORAL | Status: DC
Start: 2021-07-16 — End: 2021-07-17
  Administered 2021-07-16 – 2021-07-17 (×2): 20 mg via ORAL
  Filled 2021-07-16 (×3): qty 1

## 2021-07-16 MED ORDER — ALUM & MAG HYDROXIDE-SIMETH 200-200-20 MG/5ML PO SUSP: 30.0000 mL | Freq: Four times a day (QID) | ORAL | Status: DC | PRN

## 2021-07-16 MED ORDER — LATANOPROSTENE BUNOD 0.024 % OP SOLN: 1.0000 [drp] | Freq: Every day | OPHTHALMIC | Status: DC

## 2021-07-16 MED ORDER — PANTOPRAZOLE SODIUM 40 MG IV SOLR
40.0000 mg | Freq: Every day | INTRAVENOUS | Status: DC
  Administered 2021-07-16: 40 mg via INTRAVENOUS
  Filled 2021-07-16: qty 10

## 2021-07-16 MED ORDER — METHIMAZOLE 5 MG PO TABS
5.0000 mg | ORAL_TABLET | Freq: Every day | ORAL | Status: DC
Start: 2021-07-16 — End: 2021-07-17
  Administered 2021-07-16 – 2021-07-17 (×2): 5 mg via ORAL
  Filled 2021-07-16 (×2): qty 1

## 2021-07-16 MED ORDER — TIZANIDINE HCL 4 MG PO TABS: 2.0000 mg | ORAL_TABLET | Freq: Three times a day (TID) | ORAL | Status: DC | PRN

## 2021-07-16 MED ORDER — ASPIRIN EC 81 MG PO TBEC
81.0000 mg | DELAYED_RELEASE_TABLET | ORAL | Status: DC
  Administered 2021-07-16: 81 mg via ORAL
  Filled 2021-07-16: qty 1

## 2021-07-16 MED ORDER — FENTANYL CITRATE (PF) 250 MCG/5ML IJ SOLN
INTRAMUSCULAR | Status: DC | PRN
  Administered 2021-07-16: 150 ug via INTRAVENOUS

## 2021-07-16 MED ORDER — LIDOCAINE-EPINEPHRINE 1 %-1:100000 IJ SOLN
INTRAMUSCULAR | Status: AC
  Filled 2021-07-16: qty 1

## 2021-07-16 MED ORDER — METHOCARBAMOL 500 MG PO TABS
500.0000 mg | ORAL_TABLET | Freq: Four times a day (QID) | ORAL | Status: DC
  Administered 2021-07-16 – 2021-07-17 (×3): 500 mg via ORAL
  Filled 2021-07-16 (×3): qty 1

## 2021-07-16 MED ORDER — FENTANYL CITRATE (PF) 100 MCG/2ML IJ SOLN: 25.0000 ug | INTRAMUSCULAR | Status: DC | PRN

## 2021-07-16 MED ORDER — PROPOFOL 10 MG/ML IV BOLUS
INTRAVENOUS | Status: AC
  Filled 2021-07-16: qty 20

## 2021-07-16 MED ORDER — APOAEQUORIN 10 MG PO CAPS: 10.0000 mg | ORAL_CAPSULE | ORAL | Status: DC

## 2021-07-16 MED ORDER — HYDROMORPHONE HCL 1 MG/ML IJ SOLN: 0.5000 mg | INTRAMUSCULAR | Status: DC | PRN

## 2021-07-16 MED ORDER — LIDOCAINE 2% (20 MG/ML) 5 ML SYRINGE
INTRAMUSCULAR | Status: DC | PRN
  Administered 2021-07-16: 60 mg via INTRAVENOUS

## 2021-07-16 MED ORDER — CEFAZOLIN SODIUM-DEXTROSE 2-4 GM/100ML-% IV SOLN
2.0000 g | Freq: Three times a day (TID) | INTRAVENOUS | Status: AC
  Administered 2021-07-16 (×2): 2 g via INTRAVENOUS
  Filled 2021-07-16 (×2): qty 100

## 2021-07-16 MED ORDER — GLUCOSAMINE-MSM 1500-500 MG/30ML PO LIQD
ORAL | Status: DC
Start: 2021-07-17 — End: 2021-07-16

## 2021-07-16 MED ORDER — SODIUM CHLORIDE 0.9 % IV SOLN: 250.0000 mL | INTRAVENOUS | Status: DC

## 2021-07-16 MED ORDER — PHENYLEPHRINE 80 MCG/ML (10ML) SYRINGE FOR IV PUSH (FOR BLOOD PRESSURE SUPPORT)
PREFILLED_SYRINGE | INTRAVENOUS | Status: AC
  Filled 2021-07-16: qty 10

## 2021-07-16 MED ORDER — ACETAMINOPHEN 325 MG PO TABS: 650.0000 mg | ORAL_TABLET | ORAL | Status: DC | PRN

## 2021-07-16 MED ORDER — LACTATED RINGERS IV SOLN: INTRAVENOUS | Status: DC

## 2021-07-16 MED ORDER — SUCCINYLCHOLINE CHLORIDE 200 MG/10ML IV SOSY
PREFILLED_SYRINGE | INTRAVENOUS | Status: AC
  Filled 2021-07-16: qty 10

## 2021-07-16 MED ORDER — MELATONIN 5 MG PO TABS
5.0000 mg | ORAL_TABLET | Freq: Every evening | ORAL | Status: DC | PRN
  Administered 2021-07-16: 5 mg via ORAL
  Filled 2021-07-16: qty 1

## 2021-07-16 MED ORDER — MENTHOL 3 MG MT LOZG: 1.0000 | LOZENGE | OROMUCOSAL | Status: DC | PRN

## 2021-07-16 MED ORDER — CEFAZOLIN SODIUM-DEXTROSE 2-4 GM/100ML-% IV SOLN
2.0000 g | INTRAVENOUS | Status: AC
  Administered 2021-07-16: 2 g via INTRAVENOUS
  Filled 2021-07-16: qty 100

## 2021-07-16 MED ORDER — KETOROLAC TROMETHAMINE 15 MG/ML IJ SOLN: 15.0000 mg | Freq: Once | INTRAMUSCULAR | Status: DC | PRN

## 2021-07-16 MED ORDER — SODIUM CHLORIDE 0.9% FLUSH
3.0000 mL | Freq: Two times a day (BID) | INTRAVENOUS | Status: DC
  Administered 2021-07-16: 3 mL via INTRAVENOUS

## 2021-07-16 MED ORDER — THROMBIN 5000 UNITS EX SOLR
CUTANEOUS | Status: AC
  Filled 2021-07-16: qty 10000

## 2021-07-16 MED ORDER — BUPIVACAINE HCL (PF) 0.25 % IJ SOLN
INTRAMUSCULAR | Status: DC | PRN
  Administered 2021-07-16: 10 mL

## 2021-07-16 MED ORDER — ONDANSETRON HCL 4 MG/2ML IJ SOLN
INTRAMUSCULAR | Status: DC | PRN
  Administered 2021-07-16: 4 mg via INTRAVENOUS

## 2021-07-16 MED ORDER — ACETAMINOPHEN 650 MG RE SUPP: 650.0000 mg | RECTAL | Status: DC | PRN

## 2021-07-16 MED ORDER — CAL MAG ZINC +D3 PO TABS: ORAL_TABLET | Freq: Every day | ORAL | Status: DC

## 2021-07-16 MED ORDER — AMISULPRIDE (ANTIEMETIC) 5 MG/2ML IV SOLN: 10.0000 mg | Freq: Once | INTRAVENOUS | Status: DC | PRN

## 2021-07-16 MED ORDER — PHENYLEPHRINE HCL-NACL 20-0.9 MG/250ML-% IV SOLN
INTRAVENOUS | Status: DC | PRN
  Administered 2021-07-16: 50 ug/min via INTRAVENOUS

## 2021-07-16 MED ORDER — EPHEDRINE SULFATE-NACL 50-0.9 MG/10ML-% IV SOSY
PREFILLED_SYRINGE | INTRAVENOUS | Status: DC | PRN
  Administered 2021-07-16 (×2): 5 mg via INTRAVENOUS

## 2021-07-16 MED ORDER — PHENYLEPHRINE 80 MCG/ML (10ML) SYRINGE FOR IV PUSH (FOR BLOOD PRESSURE SUPPORT)
PREFILLED_SYRINGE | INTRAVENOUS | Status: DC | PRN
  Administered 2021-07-16: 40 ug via INTRAVENOUS
  Administered 2021-07-16: 120 ug via INTRAVENOUS
  Administered 2021-07-16: 40 ug via INTRAVENOUS
  Administered 2021-07-16 (×3): 80 ug via INTRAVENOUS
  Administered 2021-07-16: 160 ug via INTRAVENOUS

## 2021-07-16 MED ORDER — FENTANYL CITRATE (PF) 250 MCG/5ML IJ SOLN
INTRAMUSCULAR | Status: AC
  Filled 2021-07-16: qty 5

## 2021-07-16 MED ORDER — LATANOPROST 0.005 % OP SOLN
1.0000 [drp] | Freq: Every day | OPHTHALMIC | Status: DC
  Administered 2021-07-16: 1 [drp] via OPHTHALMIC
  Filled 2021-07-16 (×2): qty 2.5

## 2021-07-16 MED ORDER — PHENOL 1.4 % MT LIQD: 1.0000 | OROMUCOSAL | Status: DC | PRN

## 2021-07-16 MED ORDER — CYCLOBENZAPRINE HCL 10 MG PO TABS: 10.0000 mg | ORAL_TABLET | Freq: Three times a day (TID) | ORAL | Status: DC | PRN

## 2021-07-16 MED ORDER — PROPOFOL 10 MG/ML IV BOLUS
INTRAVENOUS | Status: DC | PRN
  Administered 2021-07-16: 200 mg via INTRAVENOUS

## 2021-07-16 MED ORDER — ACETAMINOPHEN 10 MG/ML IV SOLN: 1000.0000 mg | Freq: Once | INTRAVENOUS | Status: DC | PRN

## 2021-07-16 MED ORDER — CHLORHEXIDINE GLUCONATE 0.12 % MT SOLN
15.0000 mL | Freq: Once | OROMUCOSAL | Status: AC
  Administered 2021-07-16: 15 mL via OROMUCOSAL
  Filled 2021-07-16: qty 15

## 2021-07-16 MED ORDER — OXYCODONE-ACETAMINOPHEN 5-325 MG PO TABS
1.0000 | ORAL_TABLET | Freq: Three times a day (TID) | ORAL | Status: DC | PRN
  Administered 2021-07-16: 1 via ORAL
  Filled 2021-07-16 (×2): qty 1

## 2021-07-16 MED ORDER — ACETAMINOPHEN 500 MG PO TABS: 1000.0000 mg | ORAL_TABLET | Freq: Once | ORAL | Status: DC

## 2021-07-16 SURGICAL SUPPLY — 54 items
BAG COUNTER SPONGE SURGICOUNT (BAG) ×2 IMPLANT
BAND RUBBER #18 3X1/16 STRL (MISCELLANEOUS) ×4 IMPLANT
BENZOIN TINCTURE PRP APPL 2/3 (GAUZE/BANDAGES/DRESSINGS) ×2 IMPLANT
BLADE CLIPPER SURG (BLADE) IMPLANT
BLADE SURG 11 STRL SS (BLADE) ×2 IMPLANT
BUR CUTTER 7.0 ROUND (BURR) ×2 IMPLANT
BUR MATCHSTICK NEURO 3.0 LAGG (BURR) ×2 IMPLANT
CANISTER SUCT 3000ML PPV (MISCELLANEOUS) ×2 IMPLANT
CARTRIDGE OIL MAESTRO DRILL (MISCELLANEOUS) ×1 IMPLANT
DECANTER SPIKE VIAL GLASS SM (MISCELLANEOUS) ×2 IMPLANT
DERMABOND ADHESIVE PROPEN (GAUZE/BANDAGES/DRESSINGS) ×1
DERMABOND ADVANCED (GAUZE/BANDAGES/DRESSINGS) ×1
DERMABOND ADVANCED .7 DNX12 (GAUZE/BANDAGES/DRESSINGS) ×1 IMPLANT
DERMABOND ADVANCED .7 DNX6 (GAUZE/BANDAGES/DRESSINGS) IMPLANT
DIFFUSER DRILL AIR PNEUMATIC (MISCELLANEOUS) ×2 IMPLANT
DRAPE HALF SHEET 40X57 (DRAPES) IMPLANT
DRAPE LAPAROTOMY 100X72X124 (DRAPES) ×2 IMPLANT
DRAPE MICROSCOPE LEICA (MISCELLANEOUS) ×2 IMPLANT
DRAPE SURG 17X23 STRL (DRAPES) ×2 IMPLANT
DRSG OPSITE POSTOP 3X4 (GAUZE/BANDAGES/DRESSINGS) ×1 IMPLANT
DURAPREP 26ML APPLICATOR (WOUND CARE) ×2 IMPLANT
ELECT REM PT RETURN 9FT ADLT (ELECTROSURGICAL) ×2
ELECTRODE REM PT RTRN 9FT ADLT (ELECTROSURGICAL) ×1 IMPLANT
GAUZE 4X4 16PLY ~~LOC~~+RFID DBL (SPONGE) IMPLANT
GAUZE SPONGE 4X4 12PLY STRL (GAUZE/BANDAGES/DRESSINGS) ×2 IMPLANT
GLOVE BIO SURGEON STRL SZ7 (GLOVE) IMPLANT
GLOVE BIO SURGEON STRL SZ8 (GLOVE) ×2 IMPLANT
GLOVE BIOGEL PI IND STRL 7.0 (GLOVE) IMPLANT
GLOVE BIOGEL PI INDICATOR 7.0 (GLOVE)
GLOVE EXAM NITRILE XL STR (GLOVE) IMPLANT
GLOVE INDICATOR 8.5 STRL (GLOVE) ×2 IMPLANT
GLOVE SURG UNDER POLY LF SZ8.5 (GLOVE) ×2 IMPLANT
GOWN STRL REUS W/ TWL LRG LVL3 (GOWN DISPOSABLE) ×1 IMPLANT
GOWN STRL REUS W/ TWL XL LVL3 (GOWN DISPOSABLE) ×2 IMPLANT
GOWN STRL REUS W/TWL 2XL LVL3 (GOWN DISPOSABLE) IMPLANT
GOWN STRL REUS W/TWL LRG LVL3 (GOWN DISPOSABLE) ×2
GOWN STRL REUS W/TWL XL LVL3 (GOWN DISPOSABLE) ×4
KIT BASIN OR (CUSTOM PROCEDURE TRAY) ×2 IMPLANT
KIT TURNOVER KIT B (KITS) ×2 IMPLANT
NDL SPNL 22GX3.5 QUINCKE BK (NEEDLE) ×1 IMPLANT
NEEDLE HYPO 22GX1.5 SAFETY (NEEDLE) ×2 IMPLANT
NEEDLE SPNL 22GX3.5 QUINCKE BK (NEEDLE) ×2 IMPLANT
NS IRRIG 1000ML POUR BTL (IV SOLUTION) ×2 IMPLANT
OIL CARTRIDGE MAESTRO DRILL (MISCELLANEOUS) ×2
PACK LAMINECTOMY NEURO (CUSTOM PROCEDURE TRAY) ×2 IMPLANT
SPONGE SURGIFOAM ABS GEL SZ50 (HEMOSTASIS) ×2 IMPLANT
STRIP CLOSURE SKIN 1/2X4 (GAUZE/BANDAGES/DRESSINGS) ×2 IMPLANT
SUT VIC AB 0 CT1 18XCR BRD8 (SUTURE) ×1 IMPLANT
SUT VIC AB 0 CT1 8-18 (SUTURE) ×2
SUT VIC AB 2-0 CT1 18 (SUTURE) ×2 IMPLANT
SUT VICRYL 4-0 PS2 18IN ABS (SUTURE) ×2 IMPLANT
TOWEL GREEN STERILE (TOWEL DISPOSABLE) ×2 IMPLANT
TOWEL GREEN STERILE FF (TOWEL DISPOSABLE) ×2 IMPLANT
WATER STERILE IRR 1000ML POUR (IV SOLUTION) ×2 IMPLANT

## 2021-07-16 NOTE — Progress Notes (Signed)
PHARMACIST - PHYSICIAN ORDER COMMUNICATION ? ?CONCERNING: P&T Medication Policy on Herbal Medications ? ?DESCRIPTION:  This patient?s order for:  apoquorin and glucosamine  have been noted. ? ?This product(s) is classified as an ?herbal? or natural product. ?Due to a lack of definitive safety studies or FDA approval, nonstandard manufacturing practices, plus the potential risk of unknown drug-drug interactions while on inpatient medications, the Pharmacy and Therapeutics Committee does not permit the use of ?herbal? or natural products of this type within [facility]. ?  ?ACTION TAKEN: ?The pharmacy department is unable to verify this order at this time and your patient has been informed of this safety policy. ?Please reevaluate patient?s clinical condition at discharge and address if the herbal or natural product(s) should be resumed at that time. ? ? ?Shanetta Nicolls A. Levada Dy, PharmD, BCPS, FNKF ?Clinical Pharmacist ?[place] ?Please utilize [facility] for appropriate phone number to reach the unit pharmacist ([place]) ? ?

## 2021-07-16 NOTE — H&P (Signed)
Rachel Trevino is an 71 y.o. female.   ?Chief Complaint: Back and right leg pain ?HPI: Very pleasant 71 year old female is a progressive worsening back and right leg pain rating down posterior thigh posterior calf outside bottom of her foot consistent with an S1 nerve root pattern.  Work-up revealed a very large disc herniation L5-S1 on the right.  Due to patient's progression of clinical syndrome imaging findings and failure conservative treatment I recommended laminectomy microdiscectomy at L5-S1 on the right.  I extensively reviewed the risks and benefits of the operation with the patient as well as perioperative course expectations of outcome and alternatives of surgery and she understood and agreed to proceed forward. ? ?Past Medical History:  ?Diagnosis Date  ? Arthritis   ? oa right leg  ? Complication of anesthesia 2012  ? needed fiber optic,  scope for good vocal cord view, pt does not have letter from [facility]  on chart  ? Lymphedema 2012  ? left leg  ? Sarcoma of lower extremity, left ([place]) 2012  ? s/p surgery and radiation  ? ? ?Past Surgical History:  ?Procedure Laterality Date  ? APPENDECTOMY    ? open. denies long hospitilization with procedure  ? CERVICAL CONIZATION W/BX N/A 01/13/2018  ? Procedure: CONIZATION CERVIX;  Surgeon: Isabel Caprice, MD;  Location: [facility];  Service: Gynecology;  Laterality: N/A;  ? LASER ABLATION CONDOLAMATA N/A 01/13/2018  ? Procedure: LASER ABLATION  OF VAGINA;  Surgeon: Isabel Caprice, MD;  Location: [facility];  Service: Gynecology;  Laterality: N/A;  Need omin Laser for procedure. [name] spoke to [facility] and I called office to let them that they needed to call [employer] to schedule rep to bring omni laser. Rep # [id number] or [id number] [name]. If unable to reach [name] may have a  ? left leg surgery for sarcoma  2012  ? and radiation  ? TONSILLECTOMY    ? TOTAL HIP ARTHROPLASTY Right 03/29/2015  ? Procedure: RIGHT  TOTAL HIP ARTHROPLASTY ANTERIOR APPROACH;  Surgeon: Mcarthur Rossetti, MD;  Location: [facility];  Service: Orthopedics;  Laterality: Right;  ? TUBAL LIGATION    ? ? ?Family History  ?Problem Relation Age of Onset  ? Alzheimer's disease Mother   ? Alzheimer's disease Father   ? Alzheimer's disease Sister   ? Breast cancer Sister   ? Parkinson's disease Brother   ? Depression Sister   ? Thyroid disease Neg Hx   ? ?Social History:  reports that she has never smoked. She has never used smokeless tobacco. She reports current alcohol use. She reports that she does not use drugs. ? ?Allergies: No Known Allergies ? ?Medications Prior to Admission  ?Medication Sig Dispense Refill  ? acetaminophen (TYLENOL) 500 MG tablet Take 500 mg by mouth every 6 (six) hours as needed for moderate pain.    ? Apoaequorin (PREVAGEN) 10 MG CAPS Take 10 mg by mouth 3 (three) times a week.    ? aspirin EC 81 MG tablet Take 81 mg by mouth 3 (three) times a week.    ? Calcium Carbonate (CALCIUM 500 PO) Take 500 mg by mouth daily.    ? Glucosamine HCl-MSM (GLUCOSAMINE-MSM PO) Take 1 tablet by mouth 2 (two) times a week.    ? Melatonin 5 MG TABS Take 5 mg by mouth at bedtime as needed (sleep).    ? methimazole (TAPAZOLE) 5 MG tablet TAKE 1 TABLET (5 MG TOTAL) BY MOUTH  DAILY. 90 tablet 3  ? methocarbamol (ROBAXIN) 500 MG tablet Take 1 tablet (500 mg total) by mouth 4 (four) times daily. 40 tablet 1  ? Multiple Minerals-Vitamins (CAL MAG ZINC +D3 PO) Take 1 tablet by mouth daily.    ? Multiple Vitamin (MULTIVITAMIN) tablet Take 1 tablet by mouth daily.    ? oxyCODONE-acetaminophen (PERCOCET) 5-325 MG tablet Take 1 tablet by mouth every 8 (eight) hours as needed. 20 tablet 0  ? TRAVATAN Z 0.004 % SOLN ophthalmic solution Place 1 drop into both eyes at bedtime.  12  ? VYZULTA 0.024 % SOLN Place 1 drop into both eyes daily.    ? predniSONE (DELTASONE) 20 MG tablet 2 tabs po daily x 3 days (Patient not taking: Reported on 07/11/2021) 6 tablet 0  ?  tiZANidine (ZANAFLEX) 2 MG tablet Take 1 tablet (2 mg total) by mouth every 8 (eight) hours as needed for muscle spasms. (Patient not taking: Reported on 07/11/2021) 30 tablet 0  ? ? ?Results for orders placed or performed during the hospital encounter of 07/15/21 (from the past 48 hour(s))  ?CBC per protocol     Status: Abnormal  ? Collection Time: 07/15/21  2:26 PM  ?Result Value Ref Range  ? WBC 8.3 4.0 - 10.5 K/uL  ? RBC 4.94 3.87 - 5.11 MIL/uL  ? Hemoglobin 14.5 12.0 - 15.0 g/dL  ? HCT 39.9 36.0 - 46.0 %  ? MCV 80.8 80.0 - 100.0 fL  ? MCH 29.4 26.0 - 34.0 pg  ? MCHC 36.3 (H) 30.0 - 36.0 g/dL  ? RDW 14.1 11.5 - 15.5 %  ? Platelets 366 150 - 400 K/uL  ? nRBC 0.0 0.0 - 0.2 %  ?  Comment: Performed at [facility], [place] [address], [place], [place] [address]  ?Basic metabolic panel     Status: None  ? Collection Time: 07/15/21  2:26 PM  ?Result Value Ref Range  ? Sodium 139 135 - 145 mmol/L  ? Potassium 4.1 3.5 - 5.1 mmol/L  ? Chloride 107 98 - 111 mmol/L  ? CO2 24 22 - 32 mmol/L  ? Glucose, Bld 97 70 - 99 mg/dL  ?  Comment: Glucose reference range applies only to samples taken after fasting for at least 8 hours.  ? BUN 9 8 - 23 mg/dL  ? Creatinine, Ser 0.73 0.44 - 1.00 mg/dL  ? Calcium 9.9 8.9 - 10.3 mg/dL  ? GFR, Estimated >60 >60 mL/min  ?  Comment: (NOTE) ?Calculated using the CKD-EPI Creatinine Equation (2021) ?  ? Anion gap 8 5 - 15  ?  Comment: Performed at [facility], [place] [address], [place],  [address]  ?Surgical pcr screen     Status: Abnormal  ? Collection Time: 07/15/21  2:28 PM  ? Specimen: Nasal Mucosa; Nasal Swab  ?Result Value Ref Range  ? MRSA, PCR NEGATIVE NEGATIVE  ? Staphylococcus aureus POSITIVE (A) NEGATIVE  ?  Comment: (NOTE) ?The Xpert SA Assay (FDA approved for NASAL specimens in patients 61 ?years of age and older), is one component of a comprehensive ?surveillance program. It is not intended to diagnose infection nor to ?guide or monitor treatment. ?Performed at [facility], [place] [address] [place], [place] ?[address] ?  ? ?No results found. ? ?Review of Systems  ?Musculoskeletal:  Positive for back pain.  ?Neurological:  Positive for weakness and numbness.  ? ?Blood pressure (!) 145/97, pulse (!) 105, temperature 98.1 ?F (  36.7 ?C), temperature source Oral, resp. rate 18, height '5\' 4"'$  (1.626 m), weight 70.3 kg, SpO2 97 %. ?Physical Exam ?HENT:  ?   Head: Normocephalic.  ?   Nose: Nose normal.  ?   Mouth/Throat:  ?   Mouth: Mucous membranes are moist.  ?Eyes:  ?   Pupils: Pupils are equal, round, and reactive to light.  ?Cardiovascular:  ?   Rate and Rhythm: Normal rate.  ?   Pulses: Normal pulses.  ?Pulmonary:  ?   Effort: Pulmonary effort is normal.  ?Abdominal:  ?   General: Abdomen is flat.  ?Musculoskeletal:  ?   Cervical back: Normal range of motion.  ?Neurological:  ?   Mental Status: She is alert.  ?   Comments: Patient is awake and alert strength is 5 out of 5 iliopsoas, quads, anterior tibialis and EHL gastroc on the right is weak at 4 to 4+ out of 5  ?  ? ?Assessment/Plan ?71 year old female presents for right-sided L5-S1 microdiscectomy ? ?Elaina Hoops, MD ?07/16/2021, 11:02 AM ? ? ? ?

## 2021-07-16 NOTE — Anesthesia Procedure Notes (Signed)
Procedure Name: Intubation ?Date/Time: 07/16/2021 12:07 PM ?Performed by: Georgia Duff, CRNA ?Pre-anesthesia Checklist: Patient identified, Emergency Drugs available, Suction available and Patient being monitored ?Patient Re-evaluated:Patient Re-evaluated prior to induction ?Oxygen Delivery Method: Circle system utilized ?Preoxygenation: Pre-oxygenation with 100% oxygen ?Induction Type: IV induction ?Ventilation: Two handed mask ventilation required and Oral airway inserted - appropriate to patient size ?Laryngoscope Size: Glidescope and 3 ?Grade View: Grade II ?Tube type: Oral ?Number of attempts: 1 ?Airway Equipment and Method: Stylet and Oral airway ?Placement Confirmation: ETT inserted through vocal cords under direct vision, positive ETCO2 and breath sounds checked- equal and bilateral ?Secured at: 22 cm ?Tube secured with: Tape ?Dental Injury: Teeth and Oropharynx as per pre-operative assessment  ?Difficulty Due To: Difficulty was anticipated, Difficult Airway- due to anterior larynx and Difficult Airway- due to limited oral opening ?Comments: Previously documented difficult airway at outside facility. Elective glidescope. Two hand mask with OPA, grade II view ? ? ? ? ?

## 2021-07-16 NOTE — Anesthesia Postprocedure Evaluation (Signed)
Anesthesia Post Note ? ?Patient: ASHELYN MCCRAVY ? ?Procedure(s) Performed: Right Lumbar Five-Sacral One Microdiscectomy (Right: Spine Lumbar) ? ?  ? ?Patient location during evaluation: PACU ?Anesthesia Type: General ?Level of consciousness: awake ?Pain management: pain level controlled ?Vital Signs Assessment: post-procedure vital signs reviewed and stable ?Respiratory status: spontaneous breathing, nonlabored ventilation, respiratory function stable and patient connected to nasal cannula oxygen ?Cardiovascular status: blood pressure returned to baseline and stable ?Postop Assessment: no apparent nausea or vomiting ?Anesthetic complications: no ? ? ?No notable events documented. ? ?Last Vitals:  ?Vitals:  ? 07/16/21 1500 07/16/21 1531  ?BP: (!) 146/84 (!) 153/83  ?Pulse: 78 74  ?Resp: 14 16  ?Temp: 36.6 ?C 36.5 ?C  ?SpO2: 100% 100%  ?  ?Last Pain:  ?Vitals:  ? 07/16/21 1535  ?TempSrc:   ?PainSc: 5   ? ? ?  ?  ?  ?  ?  ?  ? ?Nazaiah Navarrete P Karim Aiello ? ? ? ? ?

## 2021-07-16 NOTE — Transfer of Care (Signed)
Immediate Anesthesia Transfer of Care Note ? ?Patient: Rachel Trevino ? ?Procedure(s) Performed: Right Lumbar Five-Sacral One Microdiscectomy (Right: Spine Lumbar) ? ?Patient Location: PACU ? ?Anesthesia Type:General ? ?Level of Consciousness: drowsy and patient cooperative ? ?Airway & Oxygen Therapy: Patient Spontanous Breathing ? ?Post-op Assessment: Report given to RN and Post -op Vital signs reviewed and stable ? ?Post vital signs: Reviewed and stable ? ?Last Vitals:  ?Vitals Value Taken Time  ?BP 136/82 07/16/21 1330  ?Temp 36.4 ?C 07/16/21 1330  ?Pulse 87 07/16/21 1336  ?Resp 14 07/16/21 1336  ?SpO2 97 % 07/16/21 1336  ?Vitals shown include unvalidated device data. ? ?Last Pain:  ?Vitals:  ? 07/16/21 1036  ?TempSrc:   ?PainSc: 7   ?   ? ?Patients Stated Pain Goal: 3 (07/16/21 1036) ? ?Complications: No notable events documented. ?

## 2021-07-16 NOTE — Op Note (Signed)
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 ?Postoperative diagnosis: Same ? ?Procedure: Lumbar laminectomy microdiscectomy L5-S1 on the right with microdissection of the right S1 nerve root microscopic discectomy ? ?Surgeon: Dominica Severin Adelayde Minney ? ?Assistant: Nash Shearer ? ?Anesthesia: General ? ?AP HPI: 71 year old female progressive worsening back right hip and leg pain consistent with S1 radiculopathy.  Work-up revealed a large disc radiation L5-S1 displacing the right S1 nerve root.  Due to patient's progression of clinical syndrome imaging findings and failed conservative treatment I recommended limited to microdiscectomy at L5-S1 on the right.  I extensively reviewed the risks and benefits of the operation with her as well as perioperative course expectations of outcome and alternatives to surgery and she understood and agreed to proceed forward. ? ?Operative procedure: Patient was brought into the OR was [place] general anesthesia positioned prone the Wilson frame her back was prepped and draped in routine sterile fashion.  Preoperative x-ray localized the appropriate level.  So a midline incision was made after infiltration of 10 cc lidocaine with epi and Bovie electrocautery was used take down subcutaneous and subperiosteal dissection carried lamina of L5-S1 on the right.  Intraoperative x-ray confirmed indication appropriate level.  Laminotomy was begun with a high-speed drill drain down the inferior aspect limb of the 5 medial facet complex and superior aspect of lamina of S1.  Then laminotomy was begun with a 2 and 3 Miller Kerrison punch.  Ligament was identified and removed in piecemeal fashion exposing the thecal sac and the S1 nerve root.  Under microscopic illumination the S1 nerve was dissected off a very large disc herniation still partially contained with the ligament made an annular rent with a nerve hook and fished out several large free fragments of disc  from underneath the S1 nerve root.  We incised the disc base cleaned of the disc base explored and removed all fragments from underneath the thecal sac and S1 nerve root exploring both proximally and distally cephalad caudally.  At the end discectomies no further stenosis on thecal sac or right S1 nerve root and was copiously irrigated meticulous hemostasis was maintained the wound was reapproximated with interrupted Vicryl skin was closed running 4 subcuticular Dermabond benzoin Steri-Strips and a sterile dressing was applied patient recovery in stable condition.  At the end the case all needle count sponge counts were correct. ?

## 2021-07-17 ENCOUNTER — Encounter (HOSPITAL_COMMUNITY): Payer: Self-pay | Admitting: Neurosurgery

## 2021-07-17 DIAGNOSIS — M6281 Muscle weakness (generalized): Secondary | ICD-10-CM | POA: Diagnosis not present

## 2021-07-17 DIAGNOSIS — M5117 Intervertebral disc disorders with radiculopathy, lumbosacral region: Secondary | ICD-10-CM | POA: Diagnosis not present

## 2021-07-17 MED ORDER — HYDROCODONE-ACETAMINOPHEN 5-325 MG PO TABS: 1.0000 | ORAL_TABLET | Freq: Four times a day (QID) | ORAL | 0 refills | Status: DC | PRN

## 2021-07-17 MED ORDER — METHOCARBAMOL 500 MG PO TABS: 500.0000 mg | ORAL_TABLET | Freq: Four times a day (QID) | ORAL | 0 refills | Status: DC

## 2021-07-17 MED FILL — Thrombin For Soln 5000 Unit: CUTANEOUS | Qty: 2 | Status: AC

## 2021-07-17 NOTE — Evaluation (Signed)
Occupational Therapy Evaluation ?Patient Details ?Name: Rachel Trevino ?MRN: [id number] ?DOB: June 11, 1950 ?Today's Date: 07/17/2021 ? ? ?History of Present Illness 71 y/o female admitted on 07/16/21 following R L5-S1 laminectomy microdiscectomy. 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 ?Clinical Impression ?  ?Mrs. Alexsia Klindt is a 71 year old woman who presents s/p back surgery with spinal precautions. Her son is present for evaluation and education in regards to spinal precautions and log roll technique.  On evaluation patient exhibited difficulty with LB dressing and toileting. Patient educated on compensatory strategies for LB dressing and toileting due to needing verbal cues and supervision to maintain spinal precautions. She demonstrates the ability to use figure four method for LB Adls and/or has assistance of her family.  Patient and son educated on other compensatory strategies for home environment - including back precautions for showering, cooking and grooming. Patient's son verbalized understanding of all education as patient has memory deficits. Will recommend HH OT at discharge to reiterate spinal precautions and compensatory strategies.  ?  ?   ? ?Recommendations for follow up therapy are one component of a multi-disciplinary discharge planning process, led by the attending physician.  Recommendations may be updated based on patient status, additional functional criteria and insurance authorization.  ? ?Follow Up Recommendations ? Home health OT  ?  ?Assistance Recommended at Discharge Frequent or constant Supervision/Assistance  ?Patient can return home with the following A little help with bathing/dressing/bathroom;Assistance with cooking/housework;Direct supervision/assist for medications management;Direct supervision/assist for financial management;Help with stairs or ramp for entrance;Assist for transportation ? ?  ?Functional Status Assessment ? Patient has had a recent decline  in their functional status and demonstrates the ability to make significant improvements in function in a reasonable and predictable amount of time.  ?Equipment Recommendations ? BSC/3in1  ?  ?Recommendations for Other Services   ? ? ?  ?Precautions / Restrictions Precautions ?Precautions: Fall;Back ?Precaution Booklet Issued: Yes (comment) ?Precaution Comments: STM deficits hindering comprehension of back precautions ?Restrictions ?Weight Bearing Restrictions: No  ? ?  ? ?Mobility Bed Mobility ?Overal bed mobility: Needs Assistance ?Bed Mobility: Supine to Sit ?Rolling: Supervision ?  ?  ?  ?  ?General bed mobility comments: supervision for safety. cues for log roll technique ?  ? ?Transfers ?Overall transfer level: Needs assistance ?Equipment used: Rolling walker (2 wheels) ?Transfers: Sit to/from Stand ?Sit to Stand: Independent ?  ?  ?  ?  ?  ?General transfer comment: supervisoin for safety ?  ? ?  ?Balance Overall balance assessment: Mild deficits observed, not formally tested ?  ?  ?  ?  ?  ?  ?  ?  ?  ?  ?  ?  ?  ?  ?  ?  ?  ?  ?   ? ?ADL either performed or assessed with clinical judgement  ? ?ADL Overall ADL's : Needs assistance/impaired ?Eating/Feeding: Independent ?  ?Grooming: Independent ?  ?Upper Body Bathing: Independent ?  ?Lower Body Bathing: Minimal assistance;Adhering to back precautions ?  ?Upper Body Dressing : Independent ?  ?Lower Body Dressing: Minimal assistance;Adhering to back precautions ?Lower Body Dressing Details (indicate cue type and reason): Patietn's son helped her don socks and shoes but she demonstrates the ability ot use figure four method to get clothes on as well ?Toilet Transfer: Supervision/safety;Rolling walker (2 wheels) ?Toilet Transfer Details (indicate cue type and reason): verbal cues for safet technique to protect back ?Toileting- Water quality scientist  and Hygiene: Supervision/safety;Sit to/from stand ?Toileting - Clothing Manipulation Details (indicate cue type and  reason): verbal cue for back precuations ?  ?  ?Functional mobility during ADLs: Supervision/safety;Rolling walker (2 wheels) ?   ? ? ? ?Vision   ?Vision Assessment?: No apparent visual deficits  ?   ?Perception   ?  ?Praxis   ?  ? ?Pertinent Vitals/Pain Pain Assessment ?Pain Assessment: No/denies pain  ? ? ? ?Hand Dominance Right ?  ?Extremity/Trunk Assessment Upper Extremity Assessment ?Upper Extremity Assessment: Overall WFL for tasks assessed ?  ?Lower Extremity Assessment ?Lower Extremity Assessment: Defer to PT evaluation ?  ?Cervical / Trunk Assessment ?Cervical / Trunk Assessment: Back Surgery ?  ?Communication Communication ?Communication: No difficulties ?  ?Cognition Arousal/Alertness: Awake/alert ?Behavior During Therapy: [facility] for tasks assessed/performed ?Overall Cognitive Status: History of cognitive impairments - at baseline ?  ?  ?  ?  ?  ?  ?  ?  ?  ?  ?  ?  ?  ?  ?  ?  ?General Comments: Collene Gobble has reported to nursing that she has had memory deficits and family history of dementia ?  ?  ?General Comments    ? ?  ?Exercises   ?  ?Shoulder Instructions    ? ? ?Home Living Family/patient expects to be discharged to:: Private residence ?Living Arrangements: Alone ?Available Help at Discharge: Family ?Type of Home: House ?Home Access: Stairs to enter ?Entrance Stairs-Number of Steps: 2-3 ?Entrance Stairs-Rails: None ?Home Layout: Multi-level ?Alternate Level Stairs-Number of Steps: 2-3 stairs in kitchen and living room per patient ?Alternate Level Stairs-Rails: None ?Bathroom Shower/Tub: Walk-in shower ?  ?Bathroom Toilet: Handicapped height ?  ?  ?Home Equipment: Conservation officer, nature (2 wheels) ?  ?Additional Comments: unsure of reliability of information provided by patient due to STM deficits and inconsistent answers during session ?  ? ?  ?Prior Functioning/Environment Prior Level of Function : Independent/Modified Independent ?  ?  ?  ?  ?  ?  ?  ?  ?  ? ?  ?  ?OT Problem List: Decreased safety  awareness;Decreased cognition;Decreased knowledge of use of DME or AE;Decreased knowledge of precautions ?  ?   ?OT Treatment/Interventions:    ?  ?OT Goals(Current goals can be found in the care plan section) Acute Rehab OT Goals ?OT Goal Formulation: All assessment and education complete, DC therapy  ?OT Frequency:   ?  ? ?Co-evaluation   ?  ?  ?  ?  ? ?  ?AM-PAC OT "6 Clicks" Daily Activity     ?Outcome Measure Help from another person eating meals?: None ?Help from another person taking care of personal grooming?: None ?Help from another person toileting, which includes using toliet, bedpan, or urinal?: A Little ?Help from another person bathing (including washing, rinsing, drying)?: A Little ?Help from another person to put on and taking off regular upper body clothing?: None ?Help from another person to put on and taking off regular lower body clothing?: A Little ?6 Click Score: 21 ?  ?End of Session Equipment Utilized During Treatment: Rolling walker (2 wheels) ?Nurse Communication: Mobility status ? ?Activity Tolerance: Patient tolerated treatment well ?Patient left: in bed;with call bell/phone within reach;with nursing/sitter in room ? ?OT Visit Diagnosis: Pain  ?              ?Time: 7169-6789 ?OT Time Calculation (min): 13 min ?Charges:  OT General Charges ?$OT Visit: 1 Visit ?OT Evaluation ?$OT Eval Low Complexity: 1 Low ? ?  Caitlain Tweed, OTR/L ?Acute Care Rehab Services  ?Office [phone] ?Pager: [phone]  ? ?Zenaida Tesar L Harmani Neto ?07/17/2021, 11:00 AM ?

## 2021-07-17 NOTE — Discharge Summary (Signed)
Physician Discharge Summary  Patient ID: Rachel Trevino MRN: [id number] DOB/AGE: [birth year] 71 y.o.  Admit date: 07/16/2021 Discharge date: 07/17/2021  Admission Diagnoses:  Right S1 radiculopathy from herniated nucleus pulposus L5-S1 right    Discharge Diagnoses: same   Discharged Condition: good  Hospital Course: The patient was admitted on 07/16/2021 and taken to the operating room where the patient underwent L5-S1right microdiscectomy. The patient tolerated the procedure well and was taken to the recovery room and then to the floor in stable condition. The hospital course was routine. There were no complications. The wound remained clean dry and intact. Pt had appropriate back soreness. No complaints of leg pain or new N/T/W. The patient remained afebrile with stable vital signs, and tolerated a regular diet. The patient continued to increase activities, and pain was well controlled with oral pain medications.   Consults: None  Significant Diagnostic Studies:  Results for orders placed or performed during the hospital encounter of 07/15/21  Surgical pcr screen   Specimen: Nasal Mucosa; Nasal Swab  Result Value Ref Range   MRSA, PCR NEGATIVE NEGATIVE   Staphylococcus aureus POSITIVE (A) NEGATIVE  CBC per protocol  Result Value Ref Range   WBC 8.3 4.0 - 10.5 K/uL   RBC 4.94 3.87 - 5.11 MIL/uL   Hemoglobin 14.5 12.0 - 15.0 g/dL   HCT 39.9 36.0 - 46.0 %   MCV 80.8 80.0 - 100.0 fL   MCH 29.4 26.0 - 34.0 pg   MCHC 36.3 (H) 30.0 - 36.0 g/dL   RDW 14.1 11.5 - 15.5 %   Platelets 366 150 - 400 K/uL   nRBC 0.0 0.0 - 0.2 %  Basic metabolic panel  Result Value Ref Range   Sodium 139 135 - 145 mmol/L   Potassium 4.1 3.5 - 5.1 mmol/L   Chloride 107 98 - 111 mmol/L   CO2 24 22 - 32 mmol/L   Glucose, Bld 97 70 - 99 mg/dL   BUN 9 8 - 23 mg/dL   Creatinine, Ser 0.73 0.44 - 1.00 mg/dL   Calcium 9.9 8.9 - 10.3 mg/dL   GFR, Estimated >60 >60 mL/min   Anion gap 8 5 - 15    DG Lumbar Spine  2-3 Views  Result Date: 07/16/2021 CLINICAL DATA:  Micro discectomy at L5-S1. EXAM: LUMBAR SPINE - 2-3 VIEW COMPARISON:  April 16, 2021. FINDINGS: Two lateral intraoperative radiographs are provided. First of 2 radiographs with a radiopaque indicator overlying the soft tissues of the back at the level of the L4-L5 interspace as reference from the lowest lumbar vertebral body in this patient with 5 lumbar type vertebral bodies shown on prior imaging. Second image shows retractors and radiopaque surgical instruments directed at the level of the L5-S1 interspace over posterior elements. Mild anterolisthesis of L4 on L5 is similar to previous imaging. IMPRESSION: 1. Intraoperative radiographs obtained for localization as described. 2. Signs of degenerative change with similar anterolisthesis of L4 on L5 and signs of disc space narrowing throughout the lumbar spine. Electronically Signed   By: Zetta Bills M.D.   On: 07/16/2021 14:09    Antibiotics:  Anti-infectives (From admission, onward)    Start     Dose/Rate Route Frequency Ordered Stop   07/16/21 1630  ceFAZolin (ANCEF) IVPB 2g/100 mL premix        2 g 200 mL/hr over 30 Minutes Intravenous Every 8 hours 07/16/21 1531 07/17/21 0016   07/16/21 1015  ceFAZolin (ANCEF) IVPB 2g/100 mL premix  2 g 200 mL/hr over 30 Minutes Intravenous On call to O.R. 07/16/21 1014 07/16/21 1221       Discharge Exam: Blood pressure 120/79, pulse 79, temperature 97.6 F (36.4 C), temperature source Oral, resp. rate 18, height '5\' 4"'$  (1.626 m), weight 70.3 kg, SpO2 100 %. Neurologic: Grossly normal Ambulating and voiding well incision cdi   Discharge Medications:   Allergies as of 07/17/2021   No Known Allergies      Medication List     TAKE these medications    acetaminophen 500 MG tablet Commonly known as: TYLENOL Take 500 mg by mouth every 6 (six) hours as needed for moderate pain.   aspirin EC 81 MG tablet Take 81 mg by mouth 3 (three) times  a week.   CAL MAG ZINC +D3 PO Take 1 tablet by mouth daily.   CALCIUM 500 PO Take 500 mg by mouth daily.   GLUCOSAMINE-MSM PO Take 1 tablet by mouth 2 (two) times a week.   HYDROcodone-acetaminophen 5-325 MG tablet Commonly known as: NORCO/VICODIN Take 1 tablet by mouth every 6 (six) hours as needed for moderate pain.   melatonin 5 MG Tabs Take 5 mg by mouth at bedtime as needed (sleep).   methimazole 5 MG tablet Commonly known as: TAPAZOLE TAKE 1 TABLET (5 MG TOTAL) BY MOUTH DAILY.   methocarbamol 500 MG tablet Commonly known as: ROBAXIN Take 1 tablet (500 mg total) by mouth 4 (four) times daily. What changed: Another medication with the same name was added. Make sure you understand how and when to take each.   methocarbamol 500 MG tablet Commonly known as: ROBAXIN Take 1 tablet (500 mg total) by mouth 4 (four) times daily. What changed: You were already taking a medication with the same name, and this prescription was added. Make sure you understand how and when to take each.   multivitamin tablet Take 1 tablet by mouth daily.   oxyCODONE-acetaminophen 5-325 MG tablet Commonly known as: Percocet Take 1 tablet by mouth every 8 (eight) hours as needed.   predniSONE 20 MG tablet Commonly known as: DELTASONE 2 tabs po daily x 3 days   Prevagen 10 MG Caps Generic drug: Apoaequorin Take 10 mg by mouth 3 (three) times a week.   tiZANidine 2 MG tablet Commonly known as: ZANAFLEX Take 1 tablet (2 mg total) by mouth every 8 (eight) hours as needed for muscle spasms.   Travatan Z 0.004 % Soln ophthalmic solution Generic drug: Travoprost (BAK Free) Place 1 drop into both eyes at bedtime.   Vyzulta 0.024 % Soln Generic drug: Latanoprostene Bunod Place 1 drop into both eyes daily.        Disposition: home   Final Dx: right L5-S1 microdiskectomy   Discharge Instructions      Remove dressing in 72 hours   Complete by: As directed    Call MD for:  difficulty  breathing, headache or visual disturbances   Complete by: As directed    Call MD for:  hives   Complete by: As directed    Call MD for:  persistant dizziness or light-headedness   Complete by: As directed    Call MD for:  persistant nausea and vomiting   Complete by: As directed    Call MD for:  redness, tenderness, or signs of infection (pain, swelling, redness, odor or green/yellow discharge around incision site)   Complete by: As directed    Call MD for:  severe uncontrolled pain   Complete by:  As directed    Call MD for:  temperature >100.4   Complete by: As directed    Diet - low sodium heart healthy   Complete by: As directed    Driving Restrictions   Complete by: As directed    No driving for 2 weeks, no riding in the car for 1 week   Increase activity slowly   Complete by: As directed    Lifting restrictions   Complete by: As directed    No lifting more than 8 lbs          Signed: Ocie Cornfield Parveen Freehling 07/17/2021, 7:39 AM

## 2021-07-17 NOTE — Evaluation (Signed)
Physical Therapy Evaluation ?Patient Details ?Name: Rachel Trevino ?MRN: [id number] ?DOB: Aug 13, 1950 ?Today's Date: 07/17/2021 ? ?History of Present Illness ? 70 y/o female admitted on 07/16/21 following R L5-S1 laminectomy microdiscectomy. 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 Patient admitted following the above procedure. Patient presents with weakness, decreased activity tolerance, and impaired cognition. Patient with STM deficits noted throughout session with repeated information provided to patient about back precautions, mobility, and safety. Patient overall supervision level with use of RW. Patient states son will be available for first few days after discharge. Patient will benefit from skilled PT services during acute stay to address listed deficits. Recommend HHPT at discharge to maximize functional mobility and safety.    ?   ? ?Recommendations for follow up therapy are one component of a multi-disciplinary discharge planning process, led by the attending physician.  Recommendations may be updated based on patient status, additional functional criteria and insurance authorization. ? ?Follow Up Recommendations Home health PT ? ?  ?Assistance Recommended at Discharge    ?Patient can return home with the following ? A little help with walking and/or transfers;A little help with bathing/dressing/bathroom;Assistance with cooking/housework;Direct supervision/assist for medications management;Direct supervision/assist for financial management;Assist for transportation;Help with stairs or ramp for entrance ? ?  ?Equipment Recommendations Rolling Kathe Wirick (2 wheels)  ?Recommendations for Other Services ? OT consult  ?  ?Functional Status Assessment Patient has had a recent decline in their functional status and demonstrates the ability to make significant improvements in function in a reasonable and predictable amount of time.  ? ?  ?Precautions / Restrictions  Precautions ?Precautions: Fall;Back ?Precaution Booklet Issued: Yes (comment) ?Precaution Comments: STM deficits hindering comprehension of back precautions ?Restrictions ?Weight Bearing Restrictions: No  ? ?  ? ?Mobility ? Bed Mobility ?Overal bed mobility: Needs Assistance ?Bed Mobility: Rolling, Sidelying to Sit, Sit to Sidelying ?Rolling: Supervision ?Sidelying to sit: Supervision ?  ?  ?Sit to sidelying: Supervision ?General bed mobility comments: supervision for safety. cues for log roll technique ?  ? ?Transfers ?Overall transfer level: Needs assistance ?Equipment used: Rolling Shikita Vaillancourt (2 wheels) ?Transfers: Sit to/from Stand ?Sit to Stand: Supervision ?  ?  ?  ?  ?  ?General transfer comment: supervisoin for safety ?  ? ?Ambulation/Gait ?Ambulation/Gait assistance: Supervision ?Gait Distance (Feet): 300 Feet ?Assistive device: Rolling Shayne Diguglielmo (2 wheels) ?Gait Pattern/deviations: Decreased stride length, Step-through pattern ?Gait velocity: decreased ?  ?  ?General Gait Details: supervision for safety. Cues for upright posture ? ?Stairs ?Stairs: Yes ?Stairs assistance: Min guard ?Stair Management: One rail Right, Step to pattern, Forwards ?Number of Stairs: 2 ?  ? ?Wheelchair Mobility ?  ? ?Modified Rankin (Stroke Patients Only) ?  ? ?  ? ?Balance Overall balance assessment: Needs assistance ?Sitting-balance support: No upper extremity supported, Feet supported ?Sitting balance-Leahy Scale: Good ?  ?  ?Standing balance support: Bilateral upper extremity supported, Reliant on assistive device for balance ?Standing balance-Leahy Scale: Poor ?Standing balance comment: reliant on UE support ?  ?  ?  ?  ?  ?  ?  ?  ?  ?  ?  ?   ? ? ? ?Pertinent Vitals/Pain Pain Assessment ?Pain Assessment: Faces ?Faces Pain Scale: Hurts little more ?Pain Location: back ?Pain Descriptors / Indicators: Grimacing ?Pain Intervention(s): Monitored during session  ? ? ?Home Living Family/patient expects to be discharged to:: Private  residence ?Living Arrangements: Alone ?Available Help at Discharge: Family ?Type of Home:  House ?Home Access: Stairs to enter ?Entrance Stairs-Rails: None ?Entrance Stairs-Number of Steps: 2-3 ?Alternate Level Stairs-Number of Steps: 2-3 stairs in kitchen and living room per patient ?Home Layout: Multi-level ?Home Equipment: Conservation officer, nature (2 wheels) ?Additional Comments: unsure of reliability of information provided by patient due to STM deficits and inconsistent answers during session  ?  ?Prior Function Prior Level of Function : Independent/Modified Independent ?  ?  ?  ?  ?  ?  ?  ?  ?  ? ? ?Hand Dominance  ?   ? ?  ?Extremity/Trunk Assessment  ? Upper Extremity Assessment ?Upper Extremity Assessment: Defer to OT evaluation ?  ? ?Lower Extremity Assessment ?Lower Extremity Assessment: Generalized weakness ?  ? ?Cervical / Trunk Assessment ?Cervical / Trunk Assessment: Back Surgery  ?Communication  ? Communication: No difficulties  ?Cognition Arousal/Alertness: Awake/alert ?Behavior During Therapy: [facility] for tasks assessed/performed ?Overall Cognitive Status: No family/caregiver present to determine baseline cognitive functioning ?  ?  ?  ?  ?  ?  ?  ?  ?  ?  ?  ?  ?  ?  ?  ?  ?General Comments: STM deficits noted with repeated questions about precautions and general mobility. ?  ?  ? ?  ?General Comments   ? ?  ?Exercises    ? ?Assessment/Plan  ?  ?PT Assessment Patient needs continued PT services  ?PT Problem List Decreased strength;Decreased activity tolerance;Decreased balance;Decreased mobility;Decreased knowledge of precautions ? ?   ?  ?PT Treatment Interventions DME instruction;Gait training;Functional mobility training;Therapeutic activities;Therapeutic exercise;Stair training;Patient/family education   ? ?PT Goals (Current goals can be found in the Care Plan section)  ?Acute Rehab PT Goals ?Patient Stated Goal: did not state ?PT Goal Formulation: With patient ?Time For Goal Achievement:  07/31/21 ?Potential to Achieve Goals: Good ? ?  ?Frequency Min 5X/week ?  ? ? ?Co-evaluation   ?  ?  ?  ?  ? ? ?  ?AM-PAC PT "6 Clicks" Mobility  ?Outcome Measure Help needed turning from your back to your side while in a flat bed without using bedrails?: A Little ?Help needed moving from lying on your back to sitting on the side of a flat bed without using bedrails?: A Little ?Help needed moving to and from a bed to a chair (including a wheelchair)?: A Little ?Help needed standing up from a chair using your arms (e.g., wheelchair or bedside chair)?: A Little ?Help needed to walk in hospital room?: A Little ?Help needed climbing 3-5 steps with a railing? : A Little ?6 Click Score: 18 ? ?  ?End of Session Equipment Utilized During Treatment: Gait belt ?Activity Tolerance: Patient tolerated treatment well ?Patient left: in bed;with bed alarm set;with call bell/phone within reach ?Nurse Communication: Mobility status ?PT Visit Diagnosis: Muscle weakness (generalized) (M62.81) ?  ? ?Time: 0272-5366 ?PT Time Calculation (min) (ACUTE ONLY): 29 min ? ? ?Charges:   PT Evaluation ?$PT Eval Moderate Complexity: 1 Mod ?PT Treatments ?$Therapeutic Activity: 8-22 mins ?  ?   ? ? ?Kennard Fildes A. Gilford Rile, PT, DPT ?Acute Rehabilitation Services ?Pager [phone] ?Office [phone] ? ? ?Aadi Bordner A Josejulian Tarango ?07/17/2021, 10:32 AM ? ?

## 2021-07-17 NOTE — Progress Notes (Signed)
Patient awaiting to be transported to her vehicle for discharge home; in no acute distress nor complaints of pain nor discomfort; moves all extremities well; incision on her back with honeycomb dressing and is clean, dry and intact; room was checked for all her belongings; discharge instructions concerning her medications, incision care, follow up appointment and when to call the doctor as needed were all discussed with patient and her son by RN and both expressed understanding on the instructions given.  ?  ?  ?  ? ?

## 2021-07-17 NOTE — Plan of Care (Signed)
?  Problem: Education: Goal: Ability to verbalize activity precautions or restrictions will improve Outcome: Completed/Met Goal: Knowledge of the prescribed therapeutic regimen will improve Outcome: Completed/Met Goal: Understanding of discharge needs will improve Outcome: Completed/Met   Problem: Activity: Goal: Ability to avoid complications of mobility impairment will improve Outcome: Completed/Met Goal: Ability to tolerate increased activity will improve Outcome: Completed/Met Goal: Will remain free from falls Outcome: Completed/Met   Problem: Bowel/Gastric: Goal: Gastrointestinal status for postoperative course will improve Outcome: Completed/Met   Problem: Clinical Measurements: Goal: Ability to maintain clinical measurements within normal limits will improve Outcome: Completed/Met Goal: Postoperative complications will be avoided or minimized Outcome: Completed/Met Goal: Diagnostic test results will improve Outcome: Completed/Met   Problem: Pain Management: Goal: Pain level will decrease Outcome: Completed/Met   Problem: Skin Integrity: Goal: Will show signs of wound healing Outcome: Completed/Met   Problem: Health Behavior/Discharge Planning: Goal: Identification of resources available to assist in meeting health care needs will improve Outcome: Completed/Met   Problem: Bladder/Genitourinary: Goal: Urinary functional status for postoperative course will improve Outcome: Completed/Met   

## 2021-07-17 NOTE — TOC Initial Note (Addendum)
Transition of Care (TOC) - Initial/Assessment Note  ? ? ?Patient Details  ?Name: Rachel Trevino ?MRN: [id number] ?Date of Birth: [birth year] ? ?Transition of Care (TOC) CM/SW Contact:    ?Marilu Favre, RN ?Phone Number: ?07/17/2021, 9:53 AM ? ?Clinical Narrative:                 ?Received message from nurse PT recommending HHPT ( await note from PT and OT).  ? ?Will need home health order and face to face from NP or MD.  ? ?Spoke to patient via phone. Patient in agreement , no preference  ? ?Will reach out to [facility] with [facility] . Cory with [facility] can accept . Await orders and OT evaluation  ? ? ?3C staff will provide any needed DME  ? ?Expected Discharge Plan: [place] ?Barriers to Discharge: No Barriers Identified ? ? ?Patient Goals and CMS Choice ?Patient states their goals for this hospitalization and ongoing recovery are:: to return to home ?CMS Medicare.gov Compare Post Acute Care list provided to:: Patient ?Choice offered to / list presented to : Patient ? ?Expected Discharge Plan and Services ?Expected Discharge Plan: [place] ?  ?Discharge Planning Services: CM Consult ?Post Acute Care Choice: Home Health ?Living arrangements for the past 2 months: [name] ?Expected Discharge Date: 07/17/21               ?  ?  ?  ?  ?  ?HH Arranged: PT ?  ?  ?  ?  ? ?Prior Living Arrangements/Services ?Living arrangements for the past 2 months: [place] ?Lives with:: Relatives ?Patient language and need for interpreter reviewed:: Yes ?Do you feel safe going back to the place where you live?: Yes      ?Need for Family Participation in Patient Care: Yes (Comment) ?Care giver support system in place?: Yes (comment) ?  ?Criminal Activity/Legal Involvement Pertinent to Current Situation/Hospitalization: No - Comment as needed ? ?Activities of Daily Living ?  ?  ? ?Permission Sought/Granted ?  ?Permission granted to share information with : No ?   ?   ?   ?   ? ?Emotional  Assessment ?  ?Attitude/Demeanor/Rapport: Engaged ?Affect (typically observed): Accepting ?Orientation: : Oriented to Self, Oriented to Place, Oriented to  Time, Oriented to Situation ?Alcohol / Substance Use: Not Applicable ?Psych Involvement: No (comment) ? ?Admission diagnosis:  HNP (herniated nucleus pulposus), lumbar [M51.26] ?Patient Active Problem List  ? Diagnosis Date Noted  ? HNP (herniated nucleus pulposus), lumbar 07/16/2021  ? Vaginal dysplasia   ? 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 Multinodular goiter 01/27/2017  ? Hyperthyroidism 01/27/2017  ? 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 Sarcoma ([place]) 01/05/2011  ? ?PCP:  Jonathon Jordan, MD ?Pharmacy:   ?[id number] [place], [place] - [id number]?Phone: [phone]: [phone]? ?[facility] [address] [place]?Phone: [phone]: [phone]? ? ? ? ?Social Determinants of Health (SDOH) Interventions ?  ? ?Readmission Risk Interventions ?   ? View : No data to display.  ?  ?  ?  ? ? ? ?

## 2021-07-23 ENCOUNTER — Telehealth: Payer: Self-pay | Admitting: Physician Assistant

## 2021-07-23 NOTE — Telephone Encounter (Signed)
Pt left a mychrt message for an follow up with PA Clark. Spoke with pt and appt not needed at this time. She has an appt with her PCP. Will call to set appt if needed.. Closed message ?

## 2021-07-24 ENCOUNTER — Other Ambulatory Visit (HOSPITAL_BASED_OUTPATIENT_CLINIC_OR_DEPARTMENT_OTHER): Payer: Self-pay

## 2021-07-25 DIAGNOSIS — Z113 Encounter for screening for infections with a predominantly sexual mode of transmission: Secondary | ICD-10-CM | POA: Diagnosis not present

## 2021-07-25 DIAGNOSIS — R413 Other amnesia: Secondary | ICD-10-CM | POA: Diagnosis not present

## 2021-07-25 DIAGNOSIS — E042 Nontoxic multinodular goiter: Secondary | ICD-10-CM | POA: Diagnosis not present

## 2021-08-05 ENCOUNTER — Other Ambulatory Visit: Payer: Self-pay | Admitting: Family Medicine

## 2021-08-05 DIAGNOSIS — R413 Other amnesia: Secondary | ICD-10-CM

## 2021-08-26 ENCOUNTER — Ambulatory Visit
Admission: RE | Admit: 2021-08-26 | Discharge: 2021-08-26 | Disposition: A | Discharge status: Home / Self Care | Payer: Medicare PPO | Source: Ambulatory Visit | Attending: Family Medicine | Admitting: Family Medicine

## 2021-08-26 DIAGNOSIS — R413 Other amnesia: Secondary | ICD-10-CM

## 2021-08-26 DIAGNOSIS — J3489 Other specified disorders of nose and nasal sinuses: Secondary | ICD-10-CM | POA: Diagnosis not present

## 2021-08-26 DIAGNOSIS — G319 Degenerative disease of nervous system, unspecified: Secondary | ICD-10-CM | POA: Diagnosis not present

## 2021-08-26 MED ORDER — GADOBENATE DIMEGLUMINE 529 MG/ML IV SOLN
15.0000 mL | Freq: Once | INTRAVENOUS | Status: AC | PRN
Start: 2021-08-26 — End: 2021-08-26
  Administered 2021-08-26: 15 mL via INTRAVENOUS

## 2021-09-01 DIAGNOSIS — D329 Benign neoplasm of meninges, unspecified: Secondary | ICD-10-CM | POA: Diagnosis not present

## 2021-09-01 DIAGNOSIS — G3184 Mild cognitive impairment, so stated: Secondary | ICD-10-CM | POA: Diagnosis not present

## 2022-02-02 ENCOUNTER — Other Ambulatory Visit: Payer: Self-pay

## 2022-02-02 ENCOUNTER — Emergency Department (HOSPITAL_BASED_OUTPATIENT_CLINIC_OR_DEPARTMENT_OTHER)
Admission: EM | Admit: 2022-02-02 | Discharge: 2022-02-02 | Disposition: A | Discharge status: Home / Self Care | Payer: Medicare PPO | Attending: Emergency Medicine | Admitting: Emergency Medicine

## 2022-02-02 ENCOUNTER — Emergency Department (HOSPITAL_BASED_OUTPATIENT_CLINIC_OR_DEPARTMENT_OTHER): Payer: Medicare PPO | Admitting: Radiology

## 2022-02-02 ENCOUNTER — Encounter (HOSPITAL_BASED_OUTPATIENT_CLINIC_OR_DEPARTMENT_OTHER): Payer: Self-pay | Admitting: Emergency Medicine

## 2022-02-02 DIAGNOSIS — M25551 Pain in right hip: Secondary | ICD-10-CM | POA: Insufficient documentation

## 2022-02-02 DIAGNOSIS — Z96641 Presence of right artificial hip joint: Secondary | ICD-10-CM | POA: Insufficient documentation

## 2022-02-02 DIAGNOSIS — Z7982 Long term (current) use of aspirin: Secondary | ICD-10-CM | POA: Diagnosis not present
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
## 2022-02-02 NOTE — Discharge Instructions (Addendum)
Please follow-up outpatient with your orthopedist.

## 2022-02-02 NOTE — ED Notes (Signed)
RN provided AVS using Teachback Method. Patient verbalizes understanding of Discharge Instructions. Opportunity for Questioning and Answers were provided by RN. Patient Discharged from ED ambulatory to Home via Self.  
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
## 2022-02-11 ENCOUNTER — Telehealth: Payer: Self-pay

## 2022-02-11 NOTE — Telephone Encounter (Signed)
     Patient  visit on 11/20  at [facility]  Have you been able to follow up with your primary care physician? Yes   The patient was or was not able to obtain any needed medicine or equipment. Yes   Are there diet recommendations that you are having difficulty following? Yes    Patient expresses understanding of discharge instructions and education provided has no other needs at this time.  Yes       [facility] [place], [facility], Care Management  [phone] [address], [place], [place] [address] Phone: [phone] Email: [email or web address]

## 2022-03-03 DIAGNOSIS — E0590 Thyrotoxicosis, unspecified without thyrotoxic crisis or storm: Secondary | ICD-10-CM | POA: Diagnosis not present

## 2022-03-03 DIAGNOSIS — E785 Hyperlipidemia, unspecified: Secondary | ICD-10-CM | POA: Diagnosis not present

## 2022-03-03 DIAGNOSIS — G3184 Mild cognitive impairment, so stated: Secondary | ICD-10-CM | POA: Diagnosis not present

## 2022-03-03 DIAGNOSIS — E042 Nontoxic multinodular goiter: Secondary | ICD-10-CM | POA: Diagnosis not present

## 2022-03-03 DIAGNOSIS — Z0000 Encounter for general adult medical examination without abnormal findings: Secondary | ICD-10-CM | POA: Diagnosis not present

## 2022-03-03 DIAGNOSIS — E559 Vitamin D deficiency, unspecified: Secondary | ICD-10-CM | POA: Diagnosis not present

## 2022-03-03 DIAGNOSIS — E7800 Pure hypercholesterolemia, unspecified: Secondary | ICD-10-CM | POA: Diagnosis not present

## 2022-03-03 DIAGNOSIS — H409 Unspecified glaucoma: Secondary | ICD-10-CM | POA: Diagnosis not present

## 2022-03-03 DIAGNOSIS — Z79899 Other long term (current) drug therapy: Secondary | ICD-10-CM | POA: Diagnosis not present

## 2022-03-05 ENCOUNTER — Other Ambulatory Visit: Payer: Self-pay | Admitting: Family Medicine

## 2022-03-05 DIAGNOSIS — D329 Benign neoplasm of meninges, unspecified: Secondary | ICD-10-CM

## 2022-03-11 DIAGNOSIS — Z1231 Encounter for screening mammogram for malignant neoplasm of breast: Secondary | ICD-10-CM | POA: Diagnosis not present

## 2022-03-26 DIAGNOSIS — H43813 Vitreous degeneration, bilateral: Secondary | ICD-10-CM | POA: Diagnosis not present

## 2022-03-26 DIAGNOSIS — H401131 Primary open-angle glaucoma, bilateral, mild stage: Secondary | ICD-10-CM | POA: Diagnosis not present

## 2022-04-04 ENCOUNTER — Ambulatory Visit
Admission: RE | Admit: 2022-04-04 | Discharge: 2022-04-04 | Disposition: A | Discharge status: Home / Self Care | Payer: Medicare PPO | Source: Ambulatory Visit | Attending: Family Medicine | Admitting: Family Medicine

## 2022-04-04 DIAGNOSIS — D329 Benign neoplasm of meninges, unspecified: Secondary | ICD-10-CM

## 2022-04-04 DIAGNOSIS — D320 Benign neoplasm of cerebral meninges: Secondary | ICD-10-CM | POA: Diagnosis not present

## 2022-04-04 MED ORDER — GADOPICLENOL 0.5 MMOL/ML IV SOLN
7.5000 mL | Freq: Once | INTRAVENOUS | Status: AC | PRN
  Administered 2022-04-04: 7.5 mL via INTRAVENOUS

## 2022-05-21 ENCOUNTER — Encounter: Payer: Self-pay | Admitting: Radiology

## 2022-07-20 ENCOUNTER — Ambulatory Visit: Payer: Medicare PPO | Admitting: "Endocrinology

## 2022-09-23 ENCOUNTER — Encounter: Payer: Self-pay | Admitting: Neurology

## 2022-09-23 ENCOUNTER — Ambulatory Visit: Payer: Medicare PPO | Admitting: Neurology

## 2022-09-23 VITALS — BP 139/81 | HR 57 | Ht 64.0 in | Wt 156.0 lb

## 2022-09-23 DIAGNOSIS — D329 Benign neoplasm of meninges, unspecified: Secondary | ICD-10-CM

## 2022-09-23 DIAGNOSIS — G3184 Mild cognitive impairment, so stated: Secondary | ICD-10-CM

## 2022-09-23 MED ORDER — DONEPEZIL HCL 5 MG PO TABS: 5.0000 mg | ORAL_TABLET | Freq: Every day | ORAL | 11 refills | Status: DC

## 2022-09-23 NOTE — Progress Notes (Unsigned)
[facility]  PATIENT: Rachel Trevino DOB: [birth year]  REQUESTING CLINICIAN: Mila Palmer, MD HISTORY FROM: Patient and both sons REASON FOR VISIT: Memory loss    HISTORICAL  CHIEF COMPLAINT:  Chief Complaint  Patient presents with   New Patient (Initial Visit)    Rm13, 2 sons present, Mild cognitive impairment moca was 12    HISTORY OF PRESENT ILLNESS:  This is a 72 year old woman past medical history of hypothyroidism, vitamin D deficiency who is presenting with her 2 sons with memory complaint.  Patient reports that she has been forgetful for the past year, she has to write everything down or else she will forget.  She does live alone and takes care of her grandkids every day.  She is still independent in activities of daily living but again has to rely on the sons and has to write things down in order to remember.  Son Simonne Come reports that the memory problem, forgetfulness has been going on for more than 2 years.  She is forgetful about recent conversation, sometimes asks the same questions and also repeats herself.  She is still independent with actives of daily living even though family is helping with the bills she still drives, cooks and able to have self-care.    TBI:   No past history of TBI Stroke:   no past history of stroke Seizures:   no past history of seizures Sleep:   no history of sleep apnea.   Mood:  patient denies anxiety and depression Family history of Dementia: Father, mother, sister (96)  Functional status: independent in all  ADLs and IADLs Patient lives alone. Cooking: Yes  Cleaning: Yes   Shopping: Yes, has to write things down Bathing: no issues Toileting: no issues Driving: Yes Bills: son took over because she did forget in the past   Ever left the stove on by accident?: Denies  Forget how to use items around the house?: Denies  Getting lost going to familiar places?: Denies Forgetting loved ones names?: Yes (extended  family)  Word finding difficulty? Sometimes  Sleep: Good    OTHER MEDICAL CONDITIONS: Hyperthyroidism, Vitamin D deficiency    REVIEW OF SYSTEMS: Full 14 system review of systems performed and negative with exception of: As noted in  HPI  ALLERGIES: No Known Allergies  HOME MEDICATIONS: Outpatient Medications Prior to Visit  Medication Sig Dispense Refill   acetaminophen (TYLENOL) 500 MG tablet Take 500 mg by mouth every 6 (six) hours as needed for moderate pain.     Apoaequorin (PREVAGEN) 10 MG CAPS Take 10 mg by mouth 3 (three) times a week.     aspirin EC 81 MG tablet Take 81 mg by mouth 3 (three) times a week.     Calcium Carbonate (CALCIUM 500 PO) Take 500 mg by mouth daily.     Glucosamine HCl-MSM (GLUCOSAMINE-MSM PO) Take 1 tablet by mouth 2 (two) times a week.     HYDROcodone-acetaminophen (NORCO/VICODIN) 5-325 MG tablet Take 1 tablet by mouth every 6 (six) hours as needed for moderate pain. 30 tablet 0   Melatonin 5 MG TABS Take 5 mg by mouth at bedtime as needed (sleep).     methimazole (TAPAZOLE) 5 MG tablet TAKE 1 TABLET (5 MG TOTAL) BY MOUTH DAILY. 90 tablet 3   methocarbamol (ROBAXIN) 500 MG tablet Take 1 tablet (500 mg total) by mouth 4 (four) times daily. 40 tablet 1   methocarbamol (ROBAXIN) 500 MG tablet Take 1 tablet (500 mg total)  by mouth 4 (four) times daily. 45 tablet 0   Multiple Minerals-Vitamins (CAL MAG ZINC +D3 PO) Take 1 tablet by mouth daily.     Multiple Vitamin (MULTIVITAMIN) tablet Take 1 tablet by mouth daily.     TRAVATAN Z 0.004 % SOLN ophthalmic solution Place 1 drop into both eyes at bedtime.  12   VYZULTA 0.024 % SOLN Place 1 drop into both eyes daily.     oxyCODONE-acetaminophen (PERCOCET) 5-325 MG tablet Take 1 tablet by mouth every 8 (eight) hours as needed. 20 tablet 0   predniSONE (DELTASONE) 20 MG tablet 2 tabs po daily x 3 days (Patient not taking: Reported on 07/11/2021) 6 tablet 0   tiZANidine (ZANAFLEX) 2 MG tablet Take 1 tablet (2 mg  total) by mouth every 8 (eight) hours as needed for muscle spasms. (Patient not taking: Reported on 07/11/2021) 30 tablet 0   No facility-administered medications prior to visit.    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 denies long hospitilization with procedure   CERVICAL CONIZATION W/BX N/A 01/13/2018   Procedure: CONIZATION CERVIX;  Surgeon: Shonna Chock, MD;  Location: [facility];  Service: Gynecology;  Laterality: N/A;   LASER ABLATION CONDOLAMATA N/A 01/13/2018   Procedure: LASER ABLATION  OF VAGINA;  Surgeon: Shonna Chock, MD;  Location: [facility];  Service: Gynecology;  Laterality: N/A;  Need omin Laser for procedure. Erskine Squibb spoke to [facility] and I called office to let them that they needed to call [employer] to schedule rep to bring omni laser. Rep # [phone] or [phone] Nicholos Johns. If unable to reach Dr. Rondel Baton office may have a   left leg surgery for sarcoma  2012   and radiation   LUMBAR LAMINECTOMY/DECOMPRESSION MICRODISCECTOMY Right 07/16/2021   Procedure: Right Lumbar Five-Sacral One Microdiscectomy;  Surgeon: Donalee Citrin, MD;  Location: [facility];  Service: Neurosurgery;  Laterality: Right;   TONSILLECTOMY     TOTAL HIP ARTHROPLASTY Right 03/29/2015   Procedure: RIGHT TOTAL HIP ARTHROPLASTY ANTERIOR APPROACH;  Surgeon: Kathryne Hitch, MD;  Location: [facility];  Service: Orthopedics;  Laterality: Right;   TUBAL LIGATION      FAMILY HISTORY: Family History  Problem Relation Age of Onset   Alzheimer's disease Mother    Alzheimer's disease  Father    Alzheimer's disease Sister    Breast cancer Sister    Parkinson's disease Brother    Depression Sister    Thyroid disease Neg Hx     SOCIAL HISTORY: Social History   Socioeconomic History   Marital status: Widowed    Spouse name: Not on file   Number of children: 3   Years of education: Not on file   Highest education level: Bachelor's degree (e.g., BA, AB, BS)  Occupational History   Not on file  Tobacco Use   Smoking status: Never   Smokeless tobacco: Never  Vaping Use   Vaping status: Never Used  Substance and Sexual Activity   Alcohol use: Yes  Alcohol/week: 4.0 standard drinks of alcohol    Types: 4 Standard drinks or equivalent per week    Comment: 1/week   Drug use: No   Sexual activity: Not Currently  Other Topics Concern   Not on file  Social History Narrative   Not on file   Social Determinants of Health   Financial Resource Strain: Not on file  Food Insecurity: Not on file  Transportation Needs: Not on file  Physical Activity: Not on file  Stress: Not on file  Social Connections: Not on file  Intimate Partner Violence: Not on file     PHYSICAL EXAM   GENERAL EXAM/CONSTITUTIONAL: Vitals:  Vitals:   09/23/22 1037 09/23/22 1055 09/23/22 1148  BP: (!) 143/75 (!) 162/90 139/81  Pulse: 61 61 (!) 57  Weight: 156 lb (70.8 kg)    Height: 5\' 4"  (1.626 m)     Body mass index is 26.78 kg/m. Wt Readings from Last 3 Encounters:  09/23/22 156 lb (70.8 kg)  02/02/22 175 lb (79.4 kg)  07/16/21 155 lb (70.3 kg)   Patient is in no distress; well developed, nourished and groomed; neck is supple  MUSCULOSKELETAL: Gait, strength, tone, movements noted in Neurologic exam below  NEUROLOGIC: MENTAL STATUS:      No data to display            09/23/2022   10:48 AM  Montreal Cognitive Assessment   Visuospatial/ Executive (0/5) 3  Naming (0/3) 1  Attention: Read list of digits (0/2) 0  Attention: Read list of letters (0/1) 1  Attention:  Serial 7 subtraction starting at 100 (0/3) 2  Language: Repeat phrase (0/2) 0  Language : Fluency (0/1) 0  Abstraction (0/2) 2  Delayed Recall (0/5) 0  Orientation (0/6) 5  Total 14     CRANIAL NERVE:  2nd, 3rd, 4th, 6th- visual fields full to confrontation, extraocular muscles intact, no nystagmus 5th - facial sensation symmetric 7th - facial strength symmetric 8th - hearing intact 9th - palate elevates symmetrically, uvula midline 11th - shoulder shrug symmetric 12th - tongue protrusion midline  MOTOR:  normal bulk and tone, full strength in the BUE, BLE  SENSORY:  normal and symmetric to light touch  COORDINATION:  finger-nose-finger, fine finger movements normal  GAIT/STATION:  normal   DIAGNOSTIC DATA (LABS, IMAGING, TESTING) - I reviewed patient records, labs, notes, testing and imaging myself where available.  Lab Results  Component Value Date   WBC 8.3 07/15/2021   HGB 14.5 07/15/2021   HCT 39.9 07/15/2021   MCV 80.8 07/15/2021   PLT 366 07/15/2021      Component Value Date/Time   NA 139 07/15/2021 1426   K 4.1 07/15/2021 1426   CL 107 07/15/2021 1426   CO2 24 07/15/2021 1426   GLUCOSE 97 07/15/2021 1426   BUN 9 07/15/2021 1426   CREATININE 0.73 07/15/2021 1426   CALCIUM 9.9 07/15/2021 1426   GFRNONAA >60 07/15/2021 1426   GFRAA >60 03/30/2015 0430   No results found for: "CHOL", "HDL", "LDLCALC", "LDLDIRECT", "TRIG", "CHOLHDL" No results found for: "HGBA1C" Lab Results  Component Value Date   VITAMINB12 837 09/23/2022   Lab Results  Component Value Date   TSH 0.454 09/23/2022    MRI Brain 04/04/2022 1. 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 2. No specific or reversible cause for memory loss.    ASSESSMENT AND PLAN  72 y.o. year old female with history of hypothyroidism, vitamin D deficiency who is presenting with  memory complaint, forgetful, has to write things down for the past 2 years.  She is still independent of  activities of daily living even though her MoCA score is 14 out of 30 today.  Patient likely has mild cognitive impairment, possibly related to early dementia.  I will proceed with a dementia labs, B12, thyroid profile and also get the ATN profile to look for Alzheimer disease biomarker.  I will contact the patient to go over the result.  We also discussed starting medication, Aricept 5 mg nightly.  We discussed side effect of medication including diarrhea, dizziness and vivid dreams.  Patient understands to contact me if she experience any side effects.  I will see him in 1 year for follow-up.   1. Mild cognitive impairment   2. Meningioma [facility])      Patient Instructions  Dementia labs including thyroid profile, B12, and ATN profile Continue other medications Start Aricept 5 mg nightly, side effect include diarrhea, vivid dreams and dizziness.  Please contact me if you experience any side effect Follow-up in 1 year or sooner if worse.  There are well-accepted and sensible ways to reduce risk for Alzheimers disease and other degenerative brain disorders .  Exercise Daily Walk A daily 20 minute walk should be part of your routine. Disease related apathy can be a significant roadblock to exercise and the only way to overcome this is to make it a daily routine and perhaps have a reward at the end (something your loved one loves to eat or drink perhaps) or a personal trainer coming to the home can also be very useful. Most importantly, the patient is much more likely to exercise if the caregiver / spouse does it with him/her. In general a structured, repetitive schedule is best.  General Health: Any diseases which effect your body will effect your brain such as a pneumonia, urinary infection, blood clot, heart attack or stroke. Keep contact with your primary care doctor for regular follow ups.  Sleep. A good nights sleep is healthy for the brain. Seven hours is recommended. If you have insomnia or  poor sleep habits we can give you some instructions. If you have sleep apnea wear your mask.  Diet: Eating a heart healthy diet is also a good idea; fish and poultry instead of red meat, nuts (mostly non-peanuts), vegetables, fruits, olive oil or canola oil (instead of butter), minimal salt (use other spices to flavor foods), whole grain rice, bread, cereal and pasta and wine in moderation.Research is now showing that the MIND diet, which is a combination of The Mediterranean diet and the DASH diet, is beneficial for cognitive processing and longevity. Information about this diet can be found in The MIND Diet, a book by Alonna Minium, MS, RDN, and online at [employer]  Finances, Power of [address] and Advance Directives: You should consider putting legal safeguards in place with regard to financial and medical decision making. While the spouse always has power of attorney for medical and financial issues in the absence of any form, you should consider what you want in case the spouse / caregiver is no longer around or capable of making decisions.   Orders Placed This Encounter  Procedures   Vitamin B12   Thyroid Panel With TSH   ATN PROFILE    Meds ordered this encounter  Medications   donepezil (ARICEPT) 5 MG tablet    Sig: Take 1 tablet (5 mg total) by mouth at bedtime.    Dispense:  30 tablet  Refill:  11    Return in about 1 year (around 09/23/2023).  I have spent a total of 65 minutes dedicated to this patient today, preparing to see patient, performing a medically appropriate examination and evaluation, ordering tests and/or medications and procedures, and counseling and educating the patient/family/caregiver; independently interpreting result and communicating results to the family/patient/caregiver; and documenting clinical information in the electronic medical record.    Windell Norfolk, MD 09/24/2022, 7:49 AM  [facility] [address] [place], [place] [address] [phone]

## 2022-09-24 NOTE — Patient Instructions (Addendum)
Dementia labs including thyroid profile, B12, and ATN profile Continue other medications Start Aricept 5 mg nightly, side effect include diarrhea, vivid dreams and dizziness.  Please contact me if you experience any side effect Follow-up in 1 year or sooner if worse.  There are well-accepted and sensible ways to reduce risk for Alzheimers disease and other degenerative brain disorders .  Exercise Daily Walk A daily 20 minute walk should be part of your routine. Disease related apathy can be a significant roadblock to exercise and the only way to overcome this is to make it a daily routine and perhaps have a reward at the end (something your loved one loves to eat or drink perhaps) or a personal trainer coming to the home can also be very useful. Most importantly, the patient is much more likely to exercise if the caregiver / spouse does it with him/her. In general a structured, repetitive schedule is best.  General Health: Any diseases which effect your body will effect your brain such as a pneumonia, urinary infection, blood clot, heart attack or stroke. Keep contact with your primary care doctor for regular follow ups.  Sleep. A good nights sleep is healthy for the brain. Seven hours is recommended. If you have insomnia or poor sleep habits we can give you some instructions. If you have sleep apnea wear your mask.  Diet: Eating a heart healthy diet is also a good idea; fish and poultry instead of red meat, nuts (mostly non-peanuts), vegetables, fruits, olive oil or canola oil (instead of butter), minimal salt (use other spices to flavor foods), whole grain rice, bread, cereal and pasta and wine in moderation.Research is now showing that the MIND diet, which is a combination of The Mediterranean diet and the DASH diet, is beneficial for cognitive processing and longevity. Information about this diet can be found in The MIND Diet, a book by Alonna Minium, MS, RDN, and online at  [employer]  Finances, Power of [address] and Advance Directives: You should consider putting legal safeguards in place with regard to financial and medical decision making. While the spouse always has power of attorney for medical and financial issues in the absence of any form, you should consider what you want in case the spouse / caregiver is no longer around or capable of making decisions.

## 2022-09-30 LAB — ATN PROFILE
A -- Beta-amyloid 42/40 Ratio: 0.094 — ABNORMAL LOW (ref >0.102)
Beta-amyloid 40: 152.26 pg/mL
Beta-amyloid 42: 14.27 pg/mL
N -- NfL, Plasma: 3.85 pg/mL (ref 0.00–7.64)
T -- p-tau181: 1.83 pg/mL — ABNORMAL HIGH (ref 0.00–0.97)

## 2022-09-30 LAB — VITAMIN B12: Vitamin B-12: 837 pg/mL (ref 232–1245)

## 2022-09-30 LAB — THYROID PANEL WITH TSH
Free Thyroxine Index: 1.9 (ref 1.2–4.9)
T3 Uptake Ratio: 25 % (ref 24–39)
T4, Total: 7.4 ug/dL (ref 4.5–12.0)
TSH: 0.454 u[IU]/mL (ref 0.450–4.500)

## 2022-10-05 DIAGNOSIS — M25512 Pain in left shoulder: Secondary | ICD-10-CM | POA: Diagnosis not present

## 2022-10-05 DIAGNOSIS — M25511 Pain in right shoulder: Secondary | ICD-10-CM | POA: Diagnosis not present

## 2022-10-14 DIAGNOSIS — D329 Benign neoplasm of meninges, unspecified: Secondary | ICD-10-CM | POA: Diagnosis not present

## 2022-10-14 DIAGNOSIS — F3342 Major depressive disorder, recurrent, in full remission: Secondary | ICD-10-CM | POA: Diagnosis not present

## 2022-10-14 DIAGNOSIS — B356 Tinea cruris: Secondary | ICD-10-CM | POA: Diagnosis not present

## 2022-10-14 DIAGNOSIS — F0390 Unspecified dementia without behavioral disturbance: Secondary | ICD-10-CM | POA: Diagnosis not present

## 2022-10-21 ENCOUNTER — Encounter: Payer: Self-pay | Admitting: Neurology

## 2022-11-20 DIAGNOSIS — M19012 Primary osteoarthritis, left shoulder: Secondary | ICD-10-CM | POA: Diagnosis not present

## 2022-11-20 DIAGNOSIS — M19011 Primary osteoarthritis, right shoulder: Secondary | ICD-10-CM | POA: Diagnosis not present

## 2022-11-27 DIAGNOSIS — D069 Carcinoma in situ of cervix, unspecified: Secondary | ICD-10-CM | POA: Diagnosis not present

## 2022-11-27 DIAGNOSIS — Z1151 Encounter for screening for human papillomavirus (HPV): Secondary | ICD-10-CM | POA: Diagnosis not present

## 2022-11-27 DIAGNOSIS — Z124 Encounter for screening for malignant neoplasm of cervix: Secondary | ICD-10-CM | POA: Diagnosis not present

## 2022-11-27 DIAGNOSIS — N871 Moderate cervical dysplasia: Secondary | ICD-10-CM | POA: Diagnosis not present

## 2022-11-27 DIAGNOSIS — Z01419 Encounter for gynecological examination (general) (routine) without abnormal findings: Secondary | ICD-10-CM | POA: Diagnosis not present

## 2022-11-27 DIAGNOSIS — Z8742 Personal history of other diseases of the female genital tract: Secondary | ICD-10-CM | POA: Diagnosis not present

## 2023-03-12 ENCOUNTER — Other Ambulatory Visit: Payer: Self-pay | Admitting: Family Medicine

## 2023-03-12 DIAGNOSIS — Z23 Encounter for immunization: Secondary | ICD-10-CM | POA: Diagnosis not present

## 2023-03-12 DIAGNOSIS — E0590 Thyrotoxicosis, unspecified without thyrotoxic crisis or storm: Secondary | ICD-10-CM | POA: Diagnosis not present

## 2023-03-12 DIAGNOSIS — E785 Hyperlipidemia, unspecified: Secondary | ICD-10-CM | POA: Diagnosis not present

## 2023-03-12 DIAGNOSIS — Z0000 Encounter for general adult medical examination without abnormal findings: Secondary | ICD-10-CM | POA: Diagnosis not present

## 2023-03-12 DIAGNOSIS — M8580 Other specified disorders of bone density and structure, unspecified site: Secondary | ICD-10-CM

## 2023-03-12 DIAGNOSIS — Z1211 Encounter for screening for malignant neoplasm of colon: Secondary | ICD-10-CM | POA: Diagnosis not present

## 2023-03-12 DIAGNOSIS — R413 Other amnesia: Secondary | ICD-10-CM | POA: Diagnosis not present

## 2023-03-12 DIAGNOSIS — Z780 Asymptomatic menopausal state: Secondary | ICD-10-CM | POA: Diagnosis not present

## 2023-04-02 DIAGNOSIS — Z1231 Encounter for screening mammogram for malignant neoplasm of breast: Secondary | ICD-10-CM | POA: Diagnosis not present

## 2023-04-07 IMAGING — MR MR HEAD WO/W CM
14 series · 48 of 48 positions shown · IV contrast (15ml Multihance)
Comparison: None Available.

CLINICAL DATA: Impaired memory ALA.R ([id number])

EXAM:
MRI HEAD WITHOUT AND WITH CONTRAST
TECHNIQUE: Multiplanar, multiecho pulse sequences of the brain and surrounding
structures were obtained without and with intravenous contrast.
CONTRAST:  15mL MULTIHANCE GADOBENATE DIMEGLUMINE 529 MG/ML IV SOLN

[Series 2: T1 · sagittal · 5.0mm · 0.45mm/px · 1 of 24 slices shown]
[im 1/24]
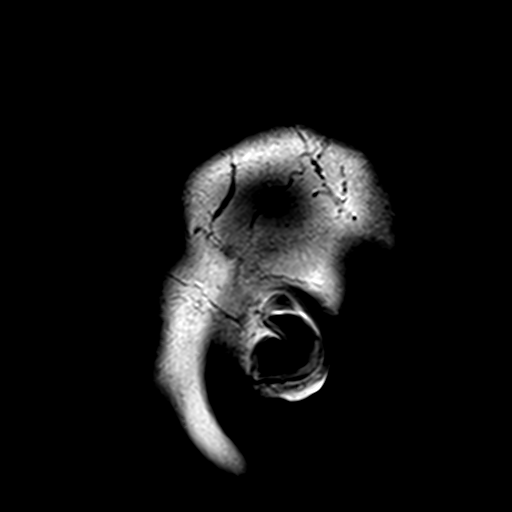

[Series 3: DWI · axial · 3.0mm · 1.80mm/px · z∈[-60,+93]mm · 6 of 103 slices shown (1 of 4)]
[im 1/103]
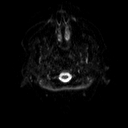
[im 21/103]
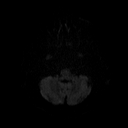
[im 41/103]
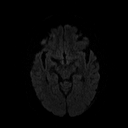
[im 62/103]
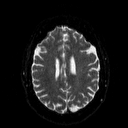
[im 82/103]
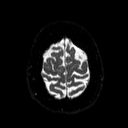
[im 103/103]
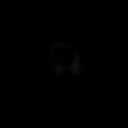

[Series 4: DWI · axial · 3.0mm · 1.80mm/px · z∈[-60,+93]mm · 3 of 52 slices shown (2 of 4)]
[im 1/52]
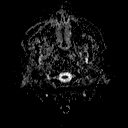
[im 26/52]
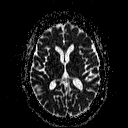
[im 52/52]
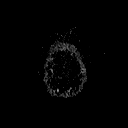

[Series 5: DWI · coronal · 5.0mm · 1.80mm/px · 4 of 70 slices shown (3 of 4)]
[im 1/70]
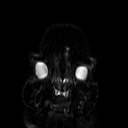
[im 24/70]
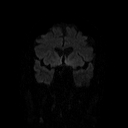
[im 47/70]
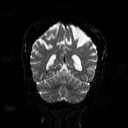
[im 70/70]
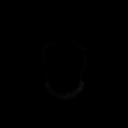

[Series 6: DWI · coronal · 5.0mm · 1.80mm/px · 2 of 35 slices shown (4 of 4)]
[im 1/35]
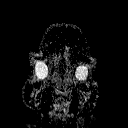
[im 35/35]
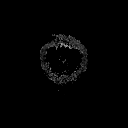

[Series 7: T2 · axial · 5.0mm · 0.60mm/px · 1 of 24 slices shown (1 of 2)]
[im 1/24]
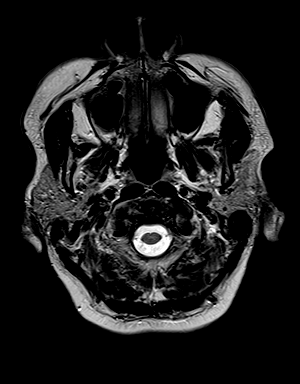

[Series 8: FLAIR · axial · 3.0mm · 0.45mm/px · z∈[-55,+89]mm · 2 of 32 slices shown]
[im 1/32]
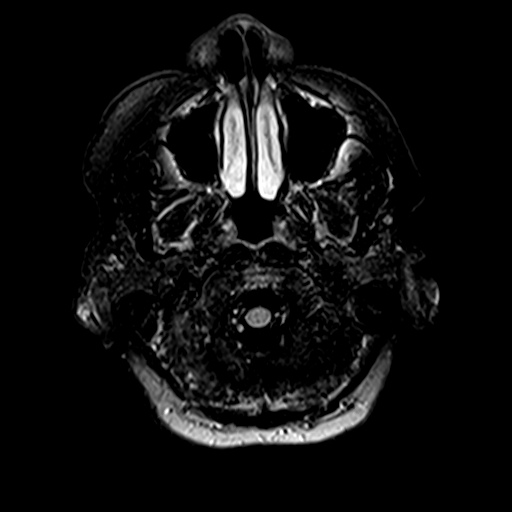
[im 32/32]
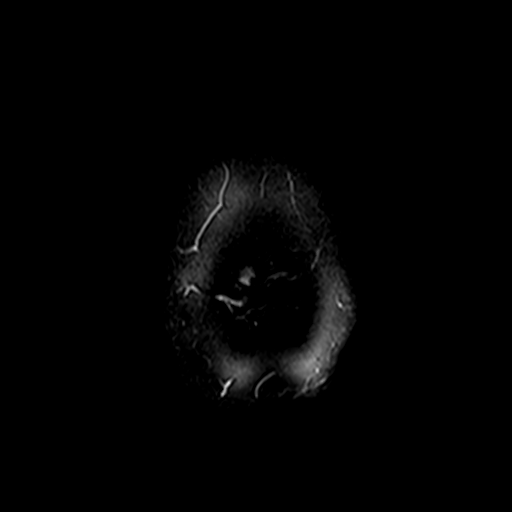

[Series 9: mip_images(sw) · axial · 32.0mm · 0.90mm/px · z∈[-40,+72]mm · 2 of 29 slices shown]
[im 1/29]
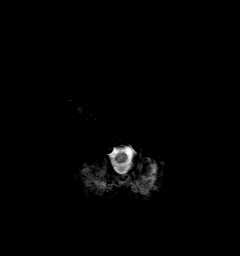
[im 29/29]
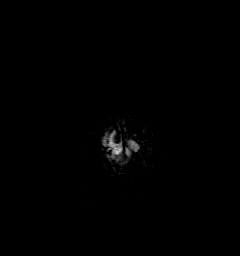

[Series 10: swi_images · axial · 4.0mm · 0.90mm/px · z∈[-54,+86]mm · 2 of 36 slices shown]
[im 1/36]
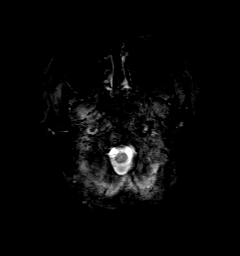
[im 36/36]
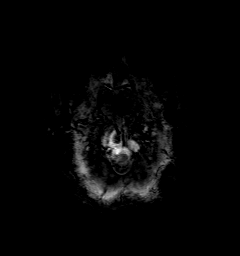

[Series 11: t1_mpr_tra · axial · 1.0mm · 0.75mm/px · z∈[-63,+96]mm · 10 of 160 slices shown]
[im 1/160]
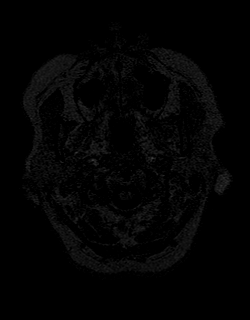
[im 18/160]
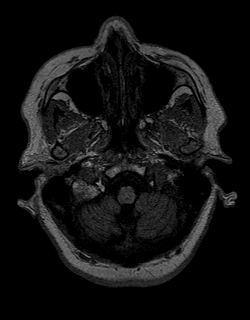
[im 36/160]
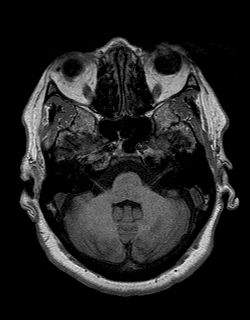
[im 54/160]
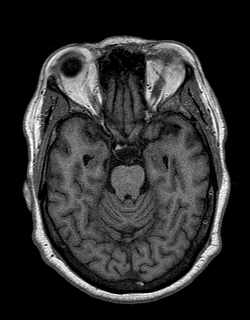
[im 71/160]
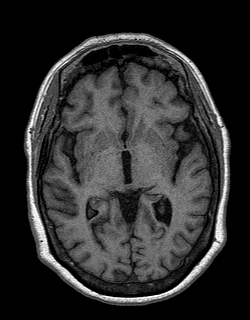
[im 89/160]
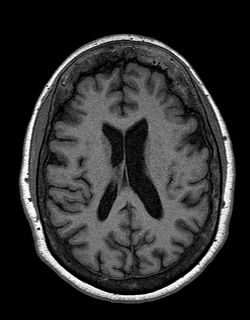
[im 107/160]
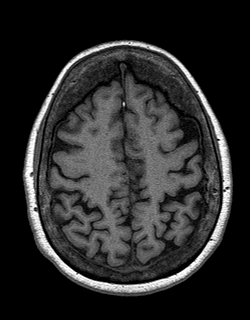
[im 124/160]
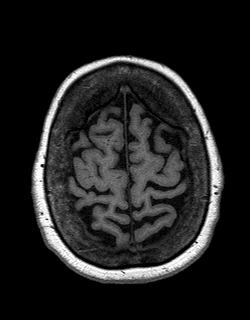
[im 142/160]
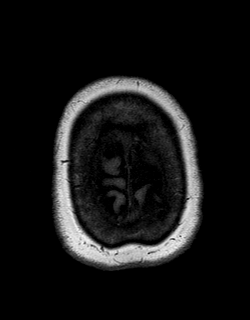
[im 160/160]
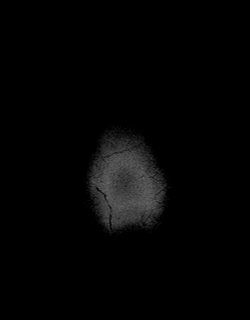

[Series 12: T2 · coronal · 5.0mm · 0.45mm/px · 2 of 28 slices shown (2 of 2)]
[im 1/28]
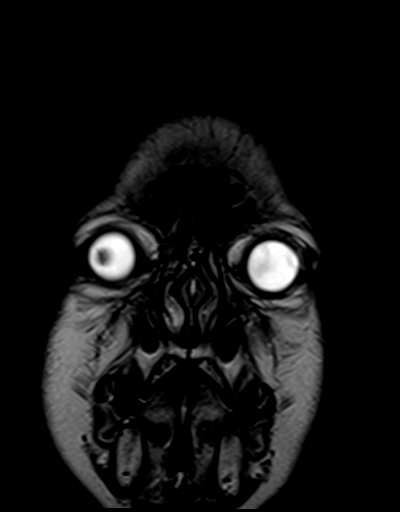
[im 28/28]
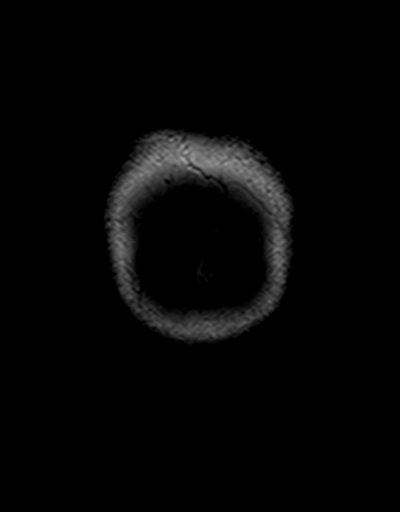

[Series 13: t1_mpr_tra post · axial · 1.0mm · 0.75mm/px · z∈[-63,+96]mm · 10 of 160 slices shown]
[im 1/160]
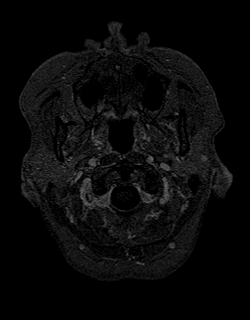
[im 18/160]
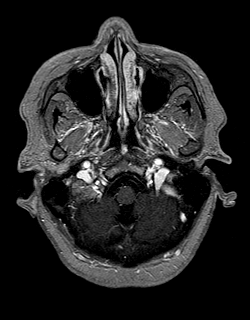
[im 36/160]
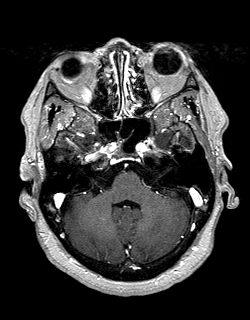
[im 54/160]
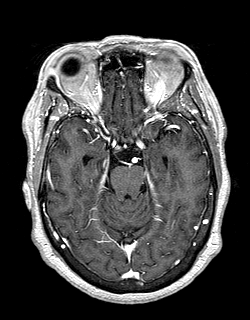
[im 71/160]
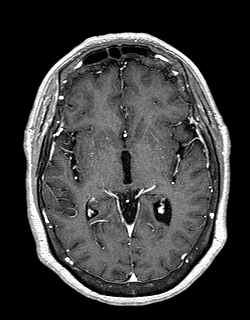
[im 89/160]
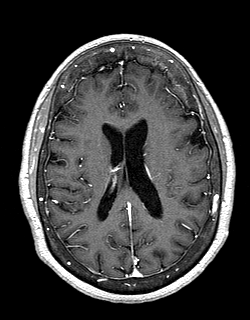
[im 107/160]
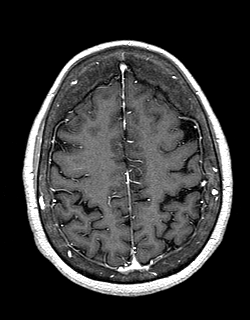
[im 124/160]
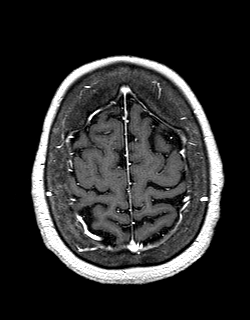
[im 142/160]
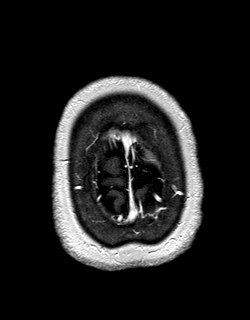
[im 160/160]
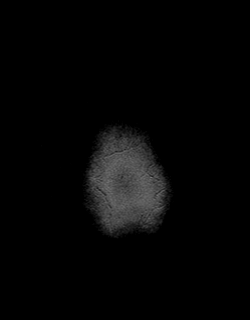

[Series 14: post cor · coronal · 5.0mm · 0.45mm/px · 2 of 28 slices shown]
[im 1/28]
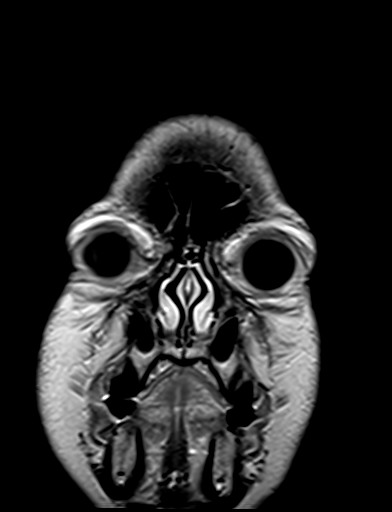
[im 28/28]
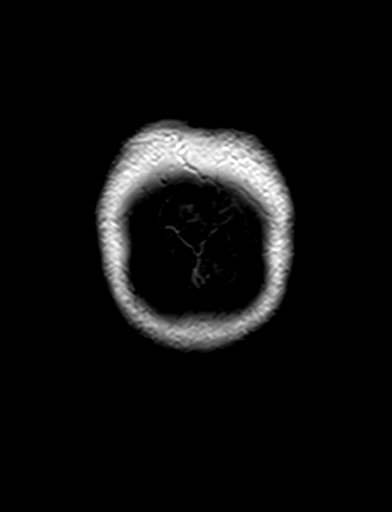

[Series 15: T1 post-contrast · sagittal · 5.0mm · 0.45mm/px · 1 of 23 slices shown]
[im 1/23]
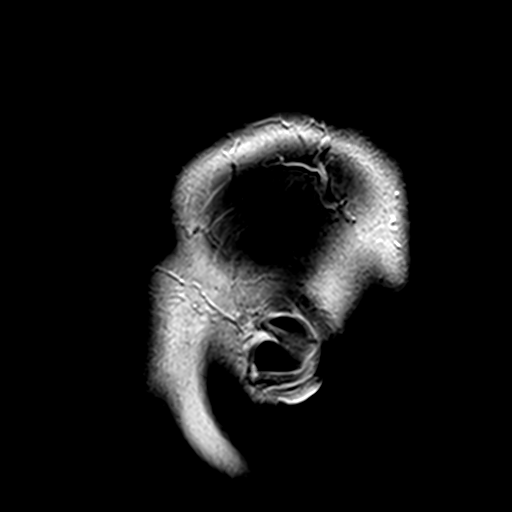

[48 of 48 positions shown; findings below may reference images not displayed]

FINDINGS: Brain: No acute infarction, hemorrhage, hydrocephalus, or
extra-axial fluid collection. 8 x 7 mm homogeneously enhancing
extra-axial dural-based lesion along the anterior left falx (series
13, image 101) compatible with a meningioma. No significant mass
effect or appreciable brain edema. Mild for age cerebral atrophy.

Vascular: Major arterial flow voids are maintained at the skull
base.

Skull and upper cervical spine: Normal marrow signal.

Sinuses/Orbits: Mild paranasal sinus mucosal thickening. No acute
orbital findings.

Other: No mastoid effusions.
IMPRESSION: 1. Findings compatible with and 8 mm meningioma along the anterior
left falx. No significant mass effect or appreciable brain edema.
2. Otherwise, unremarkable brain MRI. Mild for age cerebral atrophy.

## 2023-04-13 ENCOUNTER — Other Ambulatory Visit: Payer: Self-pay

## 2023-06-23 DIAGNOSIS — G309 Alzheimer's disease, unspecified: Secondary | ICD-10-CM | POA: Diagnosis not present

## 2023-06-23 DIAGNOSIS — M8580 Other specified disorders of bone density and structure, unspecified site: Secondary | ICD-10-CM | POA: Diagnosis not present

## 2023-06-23 DIAGNOSIS — M4800 Spinal stenosis, site unspecified: Secondary | ICD-10-CM | POA: Diagnosis not present

## 2023-06-23 DIAGNOSIS — R030 Elevated blood-pressure reading, without diagnosis of hypertension: Secondary | ICD-10-CM | POA: Diagnosis not present

## 2023-06-23 DIAGNOSIS — M1990 Unspecified osteoarthritis, unspecified site: Secondary | ICD-10-CM | POA: Diagnosis not present

## 2023-06-23 DIAGNOSIS — F325 Major depressive disorder, single episode, in full remission: Secondary | ICD-10-CM | POA: Diagnosis not present

## 2023-06-23 DIAGNOSIS — H409 Unspecified glaucoma: Secondary | ICD-10-CM | POA: Diagnosis not present

## 2023-06-23 DIAGNOSIS — D320 Benign neoplasm of cerebral meninges: Secondary | ICD-10-CM | POA: Diagnosis not present

## 2023-06-23 DIAGNOSIS — E785 Hyperlipidemia, unspecified: Secondary | ICD-10-CM | POA: Diagnosis not present

## 2023-07-15 DIAGNOSIS — M79652 Pain in left thigh: Secondary | ICD-10-CM | POA: Diagnosis not present

## 2023-07-15 DIAGNOSIS — M545 Low back pain, unspecified: Secondary | ICD-10-CM | POA: Diagnosis not present

## 2023-08-12 ENCOUNTER — Other Ambulatory Visit: Payer: Self-pay | Admitting: Neurology

## 2023-08-17 DIAGNOSIS — M5126 Other intervertebral disc displacement, lumbar region: Secondary | ICD-10-CM | POA: Diagnosis not present

## 2023-08-17 DIAGNOSIS — M5440 Lumbago with sciatica, unspecified side: Secondary | ICD-10-CM | POA: Diagnosis not present

## 2023-09-14 DIAGNOSIS — M48061 Spinal stenosis, lumbar region without neurogenic claudication: Secondary | ICD-10-CM | POA: Diagnosis not present

## 2023-09-14 DIAGNOSIS — M5126 Other intervertebral disc displacement, lumbar region: Secondary | ICD-10-CM | POA: Diagnosis not present

## 2023-09-14 DIAGNOSIS — R531 Weakness: Secondary | ICD-10-CM | POA: Diagnosis not present

## 2023-09-14 DIAGNOSIS — M5136 Other intervertebral disc degeneration, lumbar region with discogenic back pain only: Secondary | ICD-10-CM | POA: Diagnosis not present

## 2023-09-14 DIAGNOSIS — M4316 Spondylolisthesis, lumbar region: Secondary | ICD-10-CM | POA: Diagnosis not present

## 2023-09-23 ENCOUNTER — Other Ambulatory Visit (HOSPITAL_BASED_OUTPATIENT_CLINIC_OR_DEPARTMENT_OTHER): Payer: Self-pay

## 2023-09-23 ENCOUNTER — Telehealth: Payer: Self-pay | Admitting: Neurology

## 2023-09-23 ENCOUNTER — Ambulatory Visit: Payer: Medicare PPO | Admitting: Neurology

## 2023-09-23 ENCOUNTER — Encounter: Payer: Self-pay | Admitting: Neurology

## 2023-09-23 VITALS — BP 136/72 | HR 60 | Resp 15 | Ht 64.0 in | Wt 154.5 lb

## 2023-09-23 DIAGNOSIS — G309 Alzheimer's disease, unspecified: Secondary | ICD-10-CM

## 2023-09-23 DIAGNOSIS — G3184 Mild cognitive impairment, so stated: Secondary | ICD-10-CM | POA: Diagnosis not present

## 2023-09-23 DIAGNOSIS — D329 Benign neoplasm of meninges, unspecified: Secondary | ICD-10-CM

## 2023-09-23 MED ORDER — DONEPEZIL HCL 5 MG PO TABS
5.0000 mg | ORAL_TABLET | Freq: Every day | ORAL | 3 refills | Status: AC
  Filled 2023-09-23 – 2023-12-02 (×3): qty 90, 90d supply, fill #0
  Filled 2024-02-01: qty 90, 90d supply, fill #1
  Filled 2024-02-09: qty 30, 30d supply, fill #1
  Filled 2024-03-13: qty 30, 30d supply, fill #2
  Filled 2024-04-18 (×3): qty 30, 30d supply, fill #3

## 2023-09-23 NOTE — Progress Notes (Signed)
 [facility]  PATIENT: Rachel Trevino DOB: [birth year]  REQUESTING CLINICIAN: Verena Mems, MD HISTORY FROM: Patient and both sons REASON FOR VISIT: Memory loss    HISTORICAL  CHIEF COMPLAINT:  Chief Complaint  Patient presents with   Memory Loss    Rm13, son present, Memory loss: 17 moca score    INTERVAL HISTORY 09/23/2023 Patient presents today for follow-up, she is accompanied by son.  At last visit we diagnosed her with mild cognitive impairment.  Her ATN came back positive for Alzheimer disease biomarkers.  Informed patient and son that she likely has mild cognitive impairment due to Alzheimer disease versus very mild dementia.  She still lives alone, independent in all ADLs, but son does help with the bills.  She reports compliance with Aricept  no side effect. She was recently put on pain medication for back pain, which made her memory worse but since being off the pain medication, she has been doing better. Denies any recent falls.    HISTORY OF PRESENT ILLNESS:  This is a 73 year old woman past medical history of hypothyroidism, vitamin D deficiency who is presenting with her 2 sons with memory complaint.  Patient reports that she has been forgetful for the past year, she has to write everything down or else she will forget.  She does live alone and takes care of her grandkids every day.  She is still independent in activities of daily living but again has to rely on the sons and has to write things down in order to remember.  Son Vila reports that the memory problem, forgetfulness has been going on for more than 2 years.  She is forgetful about recent conversation, sometimes asks the same questions and also repeats herself.  She is still independent with actives of daily living even though family is helping with the bills she still drives, cooks and able to have self-care.    TBI:   No past history of TBI Stroke:   no past history of stroke Seizures:    no past history of seizures Sleep:   no history of sleep apnea.   Mood:  patient denies anxiety and depression Family history of Dementia: Father, mother, sister (46)  Functional status: independent in all  ADLs and IADLs Patient lives alone. Cooking: Yes  Cleaning: Yes   Shopping: Yes, has to write things down Bathing: no issues Toileting: no issues Driving: Yes Bills: son took over because she did forget in the past   Ever left the stove on by accident?: Denies  Forget how to use items around the house?: Denies  Getting lost going to familiar places?: Denies Forgetting loved ones names?: Yes (extended family)  Word finding difficulty? Sometimes  Sleep: Good    OTHER MEDICAL CONDITIONS: Hyperthyroidism, Vitamin D deficiency    REVIEW OF SYSTEMS: Full 14 system review of systems performed and negative with exception of: As noted in  HPI  ALLERGIES: No Known Allergies  HOME MEDICATIONS: Outpatient Medications Prior to Visit  Medication Sig Dispense Refill   acetaminophen  (TYLENOL ) 500 MG tablet Take 500 mg by mouth every 6 (six) hours as needed for moderate pain.     aspirin  EC 81 MG tablet Take 81 mg by mouth 3 (three) times a week.     Glucosamine HCl-MSM (GLUCOSAMINE-MSM PO) Take 1 tablet by mouth 2 (two) times a week.     melatonin 5 MG TABS Take 5 mg by mouth at bedtime.     methimazole  (TAPAZOLE ) 5 MG  tablet TAKE 1 TABLET (5 MG TOTAL) BY MOUTH DAILY. 90 tablet 3   Multiple Minerals-Vitamins (CAL MAG ZINC  +D3 PO) Take 1 tablet by mouth daily.     Multiple Vitamin (MULTIVITAMIN) tablet Take 1 tablet by mouth daily.     TRAVATAN Z 0.004 % SOLN ophthalmic solution Place 1 drop into both eyes at bedtime.  12   VYZULTA  0.024 % SOLN Place 1 drop into both eyes daily.     donepezil  (ARICEPT ) 5 MG tablet TAKE 1 TABLET BY MOUTH EVERYDAY AT BEDTIME 90 tablet 3   Apoaequorin (PREVAGEN) 10 MG CAPS Take 10 mg by mouth 3 (three) times a week.     Calcium Carbonate (CALCIUM 500 PO)  Take 500 mg by mouth daily.     HYDROcodone -acetaminophen  (NORCO/VICODIN) 5-325 MG tablet Take 1 tablet by mouth every 6 (six) hours as needed for moderate pain. 30 tablet 0   Melatonin 5 MG TABS Take 5 mg by mouth at bedtime as needed (sleep).     methocarbamol  (ROBAXIN ) 500 MG tablet Take 1 tablet (500 mg total) by mouth 4 (four) times daily. 40 tablet 1   methocarbamol  (ROBAXIN ) 500 MG tablet Take 1 tablet (500 mg total) by mouth 4 (four) times daily. 30 tablet 2   methocarbamol  (ROBAXIN ) 500 MG tablet Take 1 tablet (500 mg total) by mouth 4 (four) times daily. 45 tablet 0   No facility-administered medications prior to visit.    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 denies long hospitilization with procedure   CERVICAL CONIZATION W/BX N/A 01/13/2018   Procedure: CONIZATION CERVIX;  Surgeon: Anitra Freddy NOVAK, MD;  Location: [facility];  Service: Gynecology;  Laterality: N/A;   LASER ABLATION CONDOLAMATA N/A 01/13/2018   Procedure: LASER ABLATION  OF VAGINA;  Surgeon: Anitra Freddy NOVAK, MD;  Location: [facility];  Service: Gynecology;  Laterality: N/A;  Need omin Laser for procedure. [place] spoke to [place] and I called office to let them that they needed to call [employer] to schedule rep to bring omni laser. Rep # [phone] or [phone] [place]. If unable to reach Dr. Dianne office may have a   left leg surgery for sarcoma  2012   and radiation   LUMBAR LAMINECTOMY/DECOMPRESSION MICRODISCECTOMY Right 07/16/2021   Procedure: Right Lumbar Five-Sacral One  Microdiscectomy;  Surgeon: Onetha Kuba, MD;  Location: [facility];  Service: Neurosurgery;  Laterality: Right;   TONSILLECTOMY     TOTAL HIP ARTHROPLASTY Right 03/29/2015   Procedure: RIGHT TOTAL HIP ARTHROPLASTY ANTERIOR APPROACH;  Surgeon: Lonni CINDERELLA Poli, MD;  Location: [facility];  Service: Orthopedics;  Laterality: Right;   TUBAL LIGATION      FAMILY HISTORY: Family History  Problem Relation Age of Onset   Alzheimer's disease Mother    Alzheimer's disease Father    Alzheimer's disease Sister    Breast cancer Sister    Parkinson's disease Brother    Depression Sister    Thyroid  disease Neg Hx  SOCIAL HISTORY: Social History   Socioeconomic History   Marital status: Widowed    Spouse name: Not on file   Number of children: 3   Years of education: Not on file   Highest education level: Bachelor's degree (e.g., BA, AB, BS)  Occupational History   Not on file  Tobacco Use   Smoking status: Never   Smokeless tobacco: Never  Vaping Use   Vaping status: Never Used  Substance and Sexual Activity   Alcohol use: Yes    Alcohol/week: 4.0 standard drinks of alcohol    Types: 4 Standard drinks or equivalent per week    Comment: 1/week   Drug use: No   Sexual activity: Not Currently  Other Topics Concern   Not on file  Social History Narrative   ** Merged History Encounter **       Social Drivers of Corporate investment banker Strain: Not on file  Food Insecurity: Not on file  Transportation Needs: Not on file  Physical Activity: Not on file  Stress: Not on file  Social Connections: Not on file  Intimate Partner Violence: Not on file     PHYSICAL EXAM   GENERAL EXAM/CONSTITUTIONAL: Vitals:  Vitals:   09/23/23 1148  BP: 136/72  Pulse: 60  Resp: 15  SpO2: 98%  Weight: 154 lb 8 oz (70.1 kg)  Height: 5' 4 (1.626 m)    Body mass index is 26.52 kg/m. Wt Readings from Last 3 Encounters:  09/23/23 154 lb 8 oz (70.1 kg)  09/23/22 156 lb (70.8 kg)  02/02/22  175 lb (79.4 kg)   Patient is in no distress; well developed, nourished and groomed; neck is supple  MUSCULOSKELETAL: Gait, strength, tone, movements noted in Neurologic exam below  NEUROLOGIC: MENTAL STATUS:      No data to display            09/23/2023   11:52 AM 09/23/2022   10:48 AM  Montreal Cognitive Assessment   Visuospatial/ Executive (0/5) 2 3  Naming (0/3) 3 1  Attention: Read list of digits (0/2) 1 0  Attention: Read list of letters (0/1) 1 1  Attention: Serial 7 subtraction starting at 100 (0/3) 2 2  Language: Repeat phrase (0/2) 1 0  Language : Fluency (0/1) 0 0  Abstraction (0/2) 2 2  Delayed Recall (0/5) 0 0  Orientation (0/6) 5 5  Total 17 14     CRANIAL NERVE:  2nd, 3rd, 4th, 6th- visual fields full to confrontation, extraocular muscles intact, no nystagmus 5th - facial sensation symmetric 7th - facial strength symmetric 8th - hearing intact 9th - palate elevates symmetrically, uvula midline 11th - shoulder shrug symmetric 12th - tongue protrusion midline  MOTOR:  normal bulk and tone, full strength in the BUE, BLE  SENSORY:  normal and symmetric to light touch  COORDINATION:  finger-nose-finger, fine finger movements normal  GAIT/STATION:  Antalgic gait    DIAGNOSTIC DATA (LABS, IMAGING, TESTING) - I reviewed patient records, labs, notes, testing and imaging myself where available.  Lab Results  Component Value Date   WBC 8.3 07/15/2021   HGB 14.5 07/15/2021   HCT 39.9 07/15/2021   MCV 80.8 07/15/2021   PLT 366 07/15/2021      Component Value Date/Time   NA 139 07/15/2021 1426   K 4.1 07/15/2021 1426   CL 107 07/15/2021 1426   CO2 24 07/15/2021 1426   GLUCOSE 97 07/15/2021 1426   BUN 9 07/15/2021 1426  CREATININE 0.73 07/15/2021 1426   CALCIUM 9.9 07/15/2021 1426   GFRNONAA >60 07/15/2021 1426   GFRAA >60 03/30/2015 0430   No results found for: CHOL, HDL, LDLCALC, LDLDIRECT, TRIG, CHOLHDL No results found  for: HGBA1C Lab Results  Component Value Date   VITAMINB12 837 09/23/2022   Lab Results  Component Value Date   TSH 0.454 09/23/2022    ATN Profile consistent with presence of Alzheimer disease pathology  MRI Brain 04/04/2022 1. 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 2. No specific or reversible cause for memory loss.    ASSESSMENT AND PLAN  73 y.o. year old female with history of hypothyroidism, vitamin D deficiency, mild cognitive impairment due to Alzheimer disease who is presenting for follow-up.  Overall she is doing well, memory stable.  She is still lives alone, but son does help with her medications and bills.  She is compliant with the Aricept  5 mg nightly, denies any side effect.  Her Moca today improved from 14 to 17.  Plan will be for patient to continue with Aricept , at next visit, if no side effect we will likely increase to 10 mg nightly.  Advised him to contact me for any additional questions or concerns otherwise I will see him in a year for follow-up or sooner if worse.   1. Mild cognitive impairment (MCI) due to Alzheimer's disease (HCC)   2. Meningioma [facility])      Patient Instructions  Continue Aricept  5 mg nightly At next visit we will likely increase to 10 mg nightly Continue other medication Please contact me should you have any additional questions or concerns.  There are well-accepted and sensible ways to reduce risk for Alzheimers disease and other degenerative brain disorders .  Exercise Daily Walk A daily 20 minute walk should be part of your routine. Disease related apathy can be a significant roadblock to exercise and the only way to overcome this is to make it a daily routine and perhaps have a reward at the end (something your loved one loves to eat or drink perhaps) or a personal trainer coming to the home can also be very useful. Most importantly, the patient is much more likely to exercise if the caregiver / spouse does it with  him/her. In general a structured, repetitive schedule is best.  General Health: Any diseases which effect your body will effect your brain such as a pneumonia, urinary infection, blood clot, heart attack or stroke. Keep contact with your primary care doctor for regular follow ups.  Sleep. A good nights sleep is healthy for the brain. Seven hours is recommended. If you have insomnia or poor sleep habits we can give you some instructions. If you have sleep apnea wear your mask.  Diet: Eating a heart healthy diet is also a good idea; fish and poultry instead of red meat, nuts (mostly non-peanuts), vegetables, fruits, olive oil or canola oil (instead of butter), minimal salt (use other spices to flavor foods), whole grain rice, bread, cereal and pasta and wine in moderation.Research is now showing that the MIND diet, which is a combination of The Mediterranean diet and the DASH diet, is beneficial for cognitive processing and longevity. Information about this diet can be found in The MIND Diet, a book by Annitta Feeling, MS, RDN, and online at [employer]  Finances, Power of [address] and Advance Directives: You should consider putting legal safeguards in place with regard to financial and medical decision making. While the spouse always has power of  attorney for medical and financial issues in the absence of any form, you should consider what you want in case the spouse / caregiver is no longer around or capable of making decisions.   No orders of the defined types were placed in this encounter.   Meds ordered this encounter  Medications   donepezil  (ARICEPT ) 5 MG tablet    Sig: Take 1 tablet (5 mg total) by mouth at bedtime.    Dispense:  90 tablet    Refill:  3    Return in about 1 year (around 09/22/2024).    Pastor Falling, MD 09/23/2023, 4:37 PM  [facility] [address], [address] [place], [place] [address] [phone]

## 2023-09-23 NOTE — Patient Instructions (Signed)
 Continue Aricept  5 mg nightly At next visit we will likely increase to 10 mg nightly Continue other medication Please contact me should you have any additional questions or concerns.  There are well-accepted and sensible ways to reduce risk for Alzheimers disease and other degenerative brain disorders .  Exercise Daily Walk A daily 20 minute walk should be part of your routine. Disease related apathy can be a significant roadblock to exercise and the only way to overcome this is to make it a daily routine and perhaps have a reward at the end (something your loved one loves to eat or drink perhaps) or a personal trainer coming to the home can also be very useful. Most importantly, the patient is much more likely to exercise if the caregiver / spouse does it with him/her. In general a structured, repetitive schedule is best.  General Health: Any diseases which effect your body will effect your brain such as a pneumonia, urinary infection, blood clot, heart attack or stroke. Keep contact with your primary care doctor for regular follow ups.  Sleep. A good nights sleep is healthy for the brain. Seven hours is recommended. If you have insomnia or poor sleep habits we can give you some instructions. If you have sleep apnea wear your mask.  Diet: Eating a heart healthy diet is also a good idea; fish and poultry instead of red meat, nuts (mostly non-peanuts), vegetables, fruits, olive oil or canola oil (instead of butter), minimal salt (use other spices to flavor foods), whole grain rice, bread, cereal and pasta and wine in moderation.Research is now showing that the MIND diet, which is a combination of The Mediterranean diet and the DASH diet, is beneficial for cognitive processing and longevity. Information about this diet can be found in The MIND Diet, a book by Annitta Feeling, MS, RDN, and online at [employer]  Finances, Power of [address] and Advance Directives: You should  consider putting legal safeguards in place with regard to financial and medical decision making. While the spouse always has power of attorney for medical and financial issues in the absence of any form, you should consider what you want in case the spouse / caregiver is no longer around or capable of making decisions.

## 2023-09-23 NOTE — Telephone Encounter (Signed)
 Appointment details confirmed

## 2023-10-07 ENCOUNTER — Other Ambulatory Visit (HOSPITAL_COMMUNITY): Payer: Self-pay

## 2023-10-07 DIAGNOSIS — Z6825 Body mass index (BMI) 25.0-25.9, adult: Secondary | ICD-10-CM | POA: Diagnosis not present

## 2023-10-07 DIAGNOSIS — M5440 Lumbago with sciatica, unspecified side: Secondary | ICD-10-CM | POA: Diagnosis not present

## 2023-10-07 MED ORDER — METHYLPREDNISOLONE 4 MG PO TBPK
ORAL_TABLET | ORAL | 0 refills | Status: DC
  Filled 2023-10-07: qty 21, 6d supply, fill #0

## 2023-11-04 DIAGNOSIS — M5442 Lumbago with sciatica, left side: Secondary | ICD-10-CM | POA: Diagnosis not present

## 2023-11-30 ENCOUNTER — Other Ambulatory Visit (HOSPITAL_BASED_OUTPATIENT_CLINIC_OR_DEPARTMENT_OTHER): Payer: Self-pay

## 2023-12-02 ENCOUNTER — Other Ambulatory Visit (HOSPITAL_COMMUNITY): Payer: Self-pay

## 2024-02-01 ENCOUNTER — Other Ambulatory Visit (HOSPITAL_BASED_OUTPATIENT_CLINIC_OR_DEPARTMENT_OTHER): Payer: Self-pay

## 2024-02-09 ENCOUNTER — Other Ambulatory Visit (HOSPITAL_COMMUNITY): Payer: Self-pay

## 2024-03-13 ENCOUNTER — Other Ambulatory Visit (HOSPITAL_COMMUNITY): Payer: Self-pay

## 2024-04-18 ENCOUNTER — Other Ambulatory Visit (HOSPITAL_BASED_OUTPATIENT_CLINIC_OR_DEPARTMENT_OTHER): Payer: Self-pay

## 2024-09-25 ENCOUNTER — Ambulatory Visit: Admitting: Neurology
